# Patient Record
Sex: Female | Born: 1950 | Race: White | Hispanic: No | Smoking: Current every day smoker
Health system: Southern US, Community
[De-identification: ages and names within clinical notes are randomized; demographics above are authoritative.]

## PROBLEM LIST (undated history)

## (undated) DIAGNOSIS — K219 Gastro-esophageal reflux disease without esophagitis: Secondary | ICD-10-CM

## (undated) DIAGNOSIS — J449 Chronic obstructive pulmonary disease, unspecified: Secondary | ICD-10-CM

## (undated) DIAGNOSIS — E119 Type 2 diabetes mellitus without complications: Secondary | ICD-10-CM

## (undated) DIAGNOSIS — N289 Disorder of kidney and ureter, unspecified: Secondary | ICD-10-CM

---

## 2013-08-30 ENCOUNTER — Ambulatory Visit: Payer: Self-pay | Admitting: Family Medicine

## 2013-10-10 ENCOUNTER — Emergency Department: Payer: Self-pay | Admitting: Emergency Medicine

## 2013-10-10 LAB — BASIC METABOLIC PANEL
Anion Gap: 6 — ABNORMAL LOW (ref 7–16)
BUN: 16 mg/dL (ref 7–18)
Calcium, Total: 9.8 mg/dL (ref 8.5–10.1)
Chloride: 101 mmol/L (ref 98–107)
Co2: 26 mmol/L (ref 21–32)
Creatinine: 1.77 mg/dL — ABNORMAL HIGH (ref 0.60–1.30)
EGFR (African American): 36 — ABNORMAL LOW
EGFR (Non-African Amer.): 31 — ABNORMAL LOW
Glucose: 118 mg/dL — ABNORMAL HIGH (ref 65–99)
Osmolality: 269 (ref 275–301)
Potassium: 3.9 mmol/L (ref 3.5–5.1)
Sodium: 133 mmol/L — ABNORMAL LOW (ref 136–145)

## 2013-10-10 LAB — CBC
HCT: 43.2 % (ref 35.0–47.0)
HGB: 14.8 g/dL (ref 12.0–16.0)
MCH: 32.3 pg (ref 26.0–34.0)
MCHC: 34.2 g/dL (ref 32.0–36.0)
MCV: 94 fL (ref 80–100)
Platelet: 295 10*3/uL (ref 150–440)
RBC: 4.58 10*6/uL (ref 3.80–5.20)
RDW: 13.5 % (ref 11.5–14.5)
WBC: 10.1 10*3/uL (ref 3.6–11.0)

## 2013-10-13 ENCOUNTER — Ambulatory Visit: Payer: Self-pay | Admitting: Family Medicine

## 2013-12-06 ENCOUNTER — Ambulatory Visit: Payer: Self-pay | Admitting: Gastroenterology

## 2013-12-07 LAB — PATHOLOGY REPORT

## 2014-03-28 ENCOUNTER — Ambulatory Visit: Payer: Self-pay | Admitting: Physician Assistant

## 2014-04-24 ENCOUNTER — Ambulatory Visit: Payer: Self-pay | Admitting: Vascular Surgery

## 2014-04-24 LAB — BASIC METABOLIC PANEL
Anion Gap: 8 (ref 7–16)
BUN: 17 mg/dL (ref 7–18)
Calcium, Total: 9.6 mg/dL (ref 8.5–10.1)
Chloride: 105 mmol/L (ref 98–107)
Co2: 26 mmol/L (ref 21–32)
Creatinine: 1.89 mg/dL — ABNORMAL HIGH (ref 0.60–1.30)
EGFR (African American): 33 — ABNORMAL LOW
EGFR (Non-African Amer.): 28 — ABNORMAL LOW
Glucose: 162 mg/dL — ABNORMAL HIGH (ref 65–99)
Osmolality: 283 (ref 275–301)
Potassium: 4.5 mmol/L (ref 3.5–5.1)
Sodium: 139 mmol/L (ref 136–145)

## 2014-06-19 ENCOUNTER — Ambulatory Visit: Payer: Self-pay | Admitting: Vascular Surgery

## 2014-06-19 LAB — BASIC METABOLIC PANEL
Anion Gap: 9 (ref 7–16)
BUN: 19 mg/dL — ABNORMAL HIGH (ref 7–18)
Calcium, Total: 9.4 mg/dL (ref 8.5–10.1)
Chloride: 105 mmol/L (ref 98–107)
Co2: 26 mmol/L (ref 21–32)
Creatinine: 2.38 mg/dL — ABNORMAL HIGH (ref 0.60–1.30)
EGFR (African American): 25 — ABNORMAL LOW
EGFR (Non-African Amer.): 21 — ABNORMAL LOW
Glucose: 183 mg/dL — ABNORMAL HIGH (ref 65–99)
Osmolality: 286 (ref 275–301)
Potassium: 4.5 mmol/L (ref 3.5–5.1)
Sodium: 140 mmol/L (ref 136–145)

## 2014-06-20 ENCOUNTER — Ambulatory Visit: Payer: Self-pay | Admitting: Vascular Surgery

## 2014-06-20 LAB — CBC
HCT: 36.7 % (ref 35.0–47.0)
HGB: 12.2 g/dL (ref 12.0–16.0)
MCH: 31.6 pg (ref 26.0–34.0)
MCHC: 33.4 g/dL (ref 32.0–36.0)
MCV: 95 fL (ref 80–100)
Platelet: 266 10*3/uL (ref 150–440)
RBC: 3.88 10*6/uL (ref 3.80–5.20)
RDW: 13.3 % (ref 11.5–14.5)
WBC: 7.9 10*3/uL (ref 3.6–11.0)

## 2014-06-20 LAB — URINALYSIS, COMPLETE
Bacteria: NONE SEEN
Bilirubin,UR: NEGATIVE
Blood: NEGATIVE
Glucose,UR: NEGATIVE mg/dL (ref 0–75)
Ketone: NEGATIVE
Leukocyte Esterase: NEGATIVE
Nitrite: NEGATIVE
Ph: 7 (ref 4.5–8.0)
Protein: NEGATIVE
RBC,UR: NONE SEEN /HPF (ref 0–5)
Specific Gravity: 1.002 (ref 1.003–1.030)
Squamous Epithelial: 1
WBC UR: 1 /HPF (ref 0–5)

## 2014-06-20 LAB — BASIC METABOLIC PANEL
Anion Gap: 7 (ref 7–16)
BUN: 13 mg/dL (ref 7–18)
Calcium, Total: 9.2 mg/dL (ref 8.5–10.1)
Chloride: 99 mmol/L (ref 98–107)
Co2: 26 mmol/L (ref 21–32)
Creatinine: 1.95 mg/dL — ABNORMAL HIGH (ref 0.60–1.30)
EGFR (African American): 32 — ABNORMAL LOW
EGFR (Non-African Amer.): 27 — ABNORMAL LOW
Glucose: 88 mg/dL (ref 65–99)
Osmolality: 264 (ref 275–301)
Potassium: 4.2 mmol/L (ref 3.5–5.1)
Sodium: 132 mmol/L — ABNORMAL LOW (ref 136–145)

## 2014-06-28 ENCOUNTER — Inpatient Hospital Stay: Payer: Self-pay | Admitting: Vascular Surgery

## 2014-06-29 LAB — BASIC METABOLIC PANEL
Anion Gap: 10 (ref 7–16)
BUN: 21 mg/dL — ABNORMAL HIGH (ref 7–18)
Calcium, Total: 8.8 mg/dL (ref 8.5–10.1)
Chloride: 109 mmol/L — ABNORMAL HIGH (ref 98–107)
Co2: 24 mmol/L (ref 21–32)
Creatinine: 2 mg/dL — ABNORMAL HIGH (ref 0.60–1.30)
EGFR (African American): 31 — ABNORMAL LOW
EGFR (Non-African Amer.): 26 — ABNORMAL LOW
Glucose: 104 mg/dL — ABNORMAL HIGH (ref 65–99)
Osmolality: 288 (ref 275–301)
Potassium: 4.6 mmol/L (ref 3.5–5.1)
Sodium: 143 mmol/L (ref 136–145)

## 2014-06-29 LAB — CBC WITH DIFFERENTIAL/PLATELET
Basophil #: 0 10*3/uL (ref 0.0–0.1)
Basophil %: 0.2 %
Eosinophil #: 0 10*3/uL (ref 0.0–0.7)
Eosinophil %: 0.1 %
HCT: 32.3 % — ABNORMAL LOW (ref 35.0–47.0)
HGB: 10.9 g/dL — ABNORMAL LOW (ref 12.0–16.0)
Lymphocyte #: 0.8 10*3/uL — ABNORMAL LOW (ref 1.0–3.6)
Lymphocyte %: 10.1 %
MCH: 33.1 pg (ref 26.0–34.0)
MCHC: 33.6 g/dL (ref 32.0–36.0)
MCV: 98 fL (ref 80–100)
Monocyte #: 0.4 x10 3/mm (ref 0.2–0.9)
Monocyte %: 5.6 %
Neutrophil #: 6.8 10*3/uL — ABNORMAL HIGH (ref 1.4–6.5)
Neutrophil %: 84 %
Platelet: 240 10*3/uL (ref 150–440)
RBC: 3.28 10*6/uL — ABNORMAL LOW (ref 3.80–5.20)
RDW: 14 % (ref 11.5–14.5)
WBC: 8 10*3/uL (ref 3.6–11.0)

## 2014-06-29 LAB — PATHOLOGY REPORT

## 2014-06-29 LAB — PROTIME-INR
INR: 1.1
Prothrombin Time: 13.6 secs (ref 11.5–14.7)

## 2014-06-29 LAB — APTT: Activated PTT: 25.7 secs (ref 23.6–35.9)

## 2014-08-16 ENCOUNTER — Ambulatory Visit: Payer: Self-pay | Admitting: Internal Medicine

## 2014-08-22 ENCOUNTER — Ambulatory Visit: Payer: Self-pay | Admitting: Internal Medicine

## 2014-09-21 ENCOUNTER — Ambulatory Visit: Payer: Self-pay | Admitting: Internal Medicine

## 2014-11-06 ENCOUNTER — Ambulatory Visit: Payer: Self-pay | Admitting: Internal Medicine

## 2014-11-13 ENCOUNTER — Ambulatory Visit: Payer: Self-pay | Admitting: Internal Medicine

## 2014-11-14 ENCOUNTER — Ambulatory Visit: Payer: Self-pay | Admitting: Internal Medicine

## 2014-12-26 DIAGNOSIS — E1122 Type 2 diabetes mellitus with diabetic chronic kidney disease: Secondary | ICD-10-CM | POA: Diagnosis not present

## 2014-12-26 DIAGNOSIS — F17210 Nicotine dependence, cigarettes, uncomplicated: Secondary | ICD-10-CM | POA: Diagnosis not present

## 2014-12-26 DIAGNOSIS — J449 Chronic obstructive pulmonary disease, unspecified: Secondary | ICD-10-CM | POA: Diagnosis not present

## 2014-12-26 DIAGNOSIS — N183 Chronic kidney disease, stage 3 (moderate): Secondary | ICD-10-CM | POA: Diagnosis not present

## 2014-12-26 DIAGNOSIS — I10 Essential (primary) hypertension: Secondary | ICD-10-CM | POA: Diagnosis not present

## 2015-01-23 DIAGNOSIS — R809 Proteinuria, unspecified: Secondary | ICD-10-CM | POA: Diagnosis not present

## 2015-01-23 DIAGNOSIS — N184 Chronic kidney disease, stage 4 (severe): Secondary | ICD-10-CM | POA: Diagnosis not present

## 2015-01-23 DIAGNOSIS — N2581 Secondary hyperparathyroidism of renal origin: Secondary | ICD-10-CM | POA: Diagnosis not present

## 2015-01-25 DIAGNOSIS — E1122 Type 2 diabetes mellitus with diabetic chronic kidney disease: Secondary | ICD-10-CM | POA: Diagnosis not present

## 2015-01-25 DIAGNOSIS — F17210 Nicotine dependence, cigarettes, uncomplicated: Secondary | ICD-10-CM | POA: Diagnosis not present

## 2015-01-25 DIAGNOSIS — I10 Essential (primary) hypertension: Secondary | ICD-10-CM | POA: Diagnosis not present

## 2015-01-25 DIAGNOSIS — N183 Chronic kidney disease, stage 3 (moderate): Secondary | ICD-10-CM | POA: Diagnosis not present

## 2015-01-30 DIAGNOSIS — F17211 Nicotine dependence, cigarettes, in remission: Secondary | ICD-10-CM | POA: Diagnosis not present

## 2015-01-30 DIAGNOSIS — J449 Chronic obstructive pulmonary disease, unspecified: Secondary | ICD-10-CM | POA: Diagnosis not present

## 2015-02-15 ENCOUNTER — Emergency Department: Payer: Self-pay | Admitting: Emergency Medicine

## 2015-02-15 DIAGNOSIS — R112 Nausea with vomiting, unspecified: Secondary | ICD-10-CM | POA: Diagnosis not present

## 2015-02-15 DIAGNOSIS — Z7901 Long term (current) use of anticoagulants: Secondary | ICD-10-CM | POA: Diagnosis not present

## 2015-02-15 DIAGNOSIS — R42 Dizziness and giddiness: Secondary | ICD-10-CM | POA: Diagnosis not present

## 2015-02-15 DIAGNOSIS — R110 Nausea: Secondary | ICD-10-CM | POA: Diagnosis not present

## 2015-02-15 DIAGNOSIS — Z79899 Other long term (current) drug therapy: Secondary | ICD-10-CM | POA: Diagnosis not present

## 2015-02-15 DIAGNOSIS — I10 Essential (primary) hypertension: Secondary | ICD-10-CM | POA: Diagnosis not present

## 2015-02-15 DIAGNOSIS — Z7982 Long term (current) use of aspirin: Secondary | ICD-10-CM | POA: Diagnosis not present

## 2015-02-15 DIAGNOSIS — R531 Weakness: Secondary | ICD-10-CM | POA: Diagnosis not present

## 2015-02-15 DIAGNOSIS — E119 Type 2 diabetes mellitus without complications: Secondary | ICD-10-CM | POA: Diagnosis not present

## 2015-02-15 DIAGNOSIS — Z720 Tobacco use: Secondary | ICD-10-CM | POA: Diagnosis not present

## 2015-02-20 DIAGNOSIS — N183 Chronic kidney disease, stage 3 (moderate): Secondary | ICD-10-CM | POA: Diagnosis not present

## 2015-03-28 DIAGNOSIS — I10 Essential (primary) hypertension: Secondary | ICD-10-CM | POA: Diagnosis not present

## 2015-03-28 DIAGNOSIS — F17210 Nicotine dependence, cigarettes, uncomplicated: Secondary | ICD-10-CM | POA: Diagnosis not present

## 2015-03-28 DIAGNOSIS — E1122 Type 2 diabetes mellitus with diabetic chronic kidney disease: Secondary | ICD-10-CM | POA: Diagnosis not present

## 2015-03-28 DIAGNOSIS — I6523 Occlusion and stenosis of bilateral carotid arteries: Secondary | ICD-10-CM | POA: Diagnosis not present

## 2015-04-05 DIAGNOSIS — I6523 Occlusion and stenosis of bilateral carotid arteries: Secondary | ICD-10-CM | POA: Diagnosis not present

## 2015-04-14 NOTE — Op Note (Signed)
PATIENT NAME:  Deanna [name], WAHLER MR#:  [id number] DATE OF BIRTH:  [birth year]  DATE OF PROCEDURE:  04/24/2014  PREOPERATIVE DIAGNOSES: 1. Peripheral arterial disease with claudication, bilateral lower extremities, and gangrenous changes to right 5th toe.  2.  Hypertension.  3.  Chronic kidney disease, stage III.  POSTOPERATIVE DIAGNOSES: 1. Peripheral arterial disease with claudication, bilateral lower extremities, and gangrenous changes to right 5th toe.  2.  Hypertension.  3.  Chronic kidney disease, stage III.  PROCEDURES PERFORMED: 1.  Ultrasound guidance for vascular access to bilateral femoral arteries.  2.  Catheter placement to aorta from bilateral femoral approaches.  3.  Aortogram and selective right lower extremity angiogram.  4.  Percutaneous transluminal angioplasty with drug-coated angioplasty balloon on the right and conventional angioplasty balloon on the left.  5.  Kissing balloon stents to bilateral common iliac arteries and distal aorta for greater than 50% residual stenosis after angioplasty.  6.  StarClose closure device to bilateral femoral arteries.   SURGEON: Annice Needy, M.D.   ANESTHESIA: Local with moderate conscious sedation.   ESTIMATED BLOOD LOSS: 25 mL.   CONTRAST USED: 80 mL Visipaque.   INDICATION FOR PROCEDURE: A 64 year old white female presented to the office with gangrenous changes to the tip of the right 5th toe, as well as bilateral lower extremity claudication. Her ABIs were markedly reduced on the right and mildly reduced on the left. She is brought in for angiography for further evaluation and potential treatment. Risks and benefits were discussed. Informed consent was obtained.   DESCRIPTION OF PROCEDURE: The patient was brought to the vascular suite. Groins were shaved and prepped and a sterile surgical field was created. Ultrasound was used to visualize patent femoral arteries bilaterally, initially on the left, and the arteries were  accessed under direct ultrasound guidance. 6-French sheaths were placed bilaterally.  Imaging performed through the pigtail catheter placed up the left, and showed a 95% stenosis in the proximal right common iliac artery at its origin. The distal aorta was diseased. The left common iliac artery had also had some degree of disease, although it was not as tight. The patient was systemically heparinized with 4000 units of intravenous heparin for systemic anticoagulation. I then performed kissing balloon angioplasty in the proximal common iliac arteries going up into the distal aorta; a 6 mm diameter Lutonix balloon on the right and a 6 mm diameter conventional balloon on the left was used. Following this, there was still residual disease in the distal aorta and the proximal common iliac artery, worse on the right with a dissection. I then elected to place balloon expandable stents; 7 mm diameter x 59 mm length balloon expandable stents were placed in the common iliac arteries bilaterally, and going up into the distal aorta for about a centimeter, and these were deployed encompassing the lesions. Waists were taken, which resolved with angioplasty. Completion angiogram following this showed markedly improved flow with stents being widely patent. Through the right femoral sheath, I performed right lower extremity angiogram, which showed no flow-limiting stenosis in the common femoral artery, superficial femoral or popliteal artery, with the best runoff being through the posterior tibial artery into the foot. The sheath was removed. StarClose closure device was deployed in the usual fashion with excellent hemostatic result. The patient tolerated the procedure well and was taken to the recovery room in stable condition.    ____________________________ Annice Needy, MD jsd:dmm D: 04/24/2014 10:42:28 ET T: 04/24/2014 10:57:35 ET JOB#: [id number]  cc:  Annice NeedyJason S. Dew, MD, <Dictator> Annice NeedyJASON S DEW MD ELECTRONICALLY SIGNED  04/27/2014 14:25

## 2015-04-14 NOTE — Op Note (Signed)
PATIENT NAME:  Deanna [name], Deanna [name] MR#:  [id number] DATE OF BIRTH:  September 02, 1951  DATE OF PROCEDURE:  06/19/2014  PREOPERATIVE DIAGNOSES: 1.  Left carotid artery stenosis on duplex.  2.  Chronic kidney disease precluding CT angiogram.  3.  Peripheral arterial disease status post previous intervention.  4.  Hypertension.   POSTOPERATIVE DIAGNOSES: 1.  Left carotid artery stenosis on duplex.  2.  Chronic kidney disease precluding CT angiogram.  3.  Peripheral arterial disease status post previous intervention.  4.  Hypertension.   PROCEDURES: 1.  Ultrasound guidance for vascular access, right femoral artery.  2.  Catheter placement into left common carotid artery from right femoral approach.  3.  Thoracic aortogram and cervical and cerebral left carotid angiogram.  4.  StarClose closure device, right femoral artery.   SURGEON: Annice NeedyJason S. Dew, M.D.   ANESTHESIA: Local with moderate conscious sedation.   ESTIMATED BLOOD LOSS: 25 mL.  FLUOROSCOPY TIME: Approximately 3 minutes.  CONTRAST USED: Approximately 32 mL.  INDICATION FOR PROCEDURE: This is a 64 year old female with multiple medical issues. I treated her for peripheral arterial disease several weeks ago. She has significant chronic kidney disease. A duplex has shown a high-grade left carotid artery stenosis. She cannot get a CT angiogram due to her renal insufficiency. We are performing a catheter-based angiogram with a limited amount of contrast to further evaluate this lesion. Risks and benefits were discussed. Informed consent was obtained.   DESCRIPTION OF PROCEDURE: The patient was brought to the vascular suite. Groins were sterilely prepped and draped and a sterile surgical field was created. The right femoral artery was accessed under direct ultrasound guidance without difficulty with a Seldinger needle. A J-wire and 5-French sheath were then placed. A pigtail catheter was placed into the ascending aorta and a LAO projection  thoracic aortogram was performed. This demonstrated a bovine configuration of the arch without flow-limiting stenosis in the proximal portions of the great vessel. The left common carotid artery did fill a little bit slower indicating more distal disease. I then gave the patient 3000 units of intravenous heparin. A JB-2 catheter was used to selectively cannulate the left common carotid artery, which was done without difficulty. It was advanced to the mid left common carotid artery and performed cervical and cerebral left carotid angiography. This demonstrated a greater than 95% high-grade stenosis of the left internal carotid artery at its origin. The external carotid artery also had disease. There appeared to be a reasonably low carotid bifurcation, in the C4 range. LAO lateral and AP projections were performed to confirm the stenosis. Intracerebral imaging was performed, and she was found to have quite pruned circulation in the intracerebral portion of the internal carotid artery due to the stenosis below. However, there did not appear to be any focal deficits present. At point I elevated to terminate the procedure. The sheath was removed. A StarClose closure device was deployed in the usual fashion with excellent hemostatic result. The patient tolerated the procedure well and was taken to the recovery room in stable condition.   ____________________________ Annice NeedyJason S. Dew, MD jsd:sb D: 06/19/2014 08:53:54 ET T: 06/19/2014 09:33:06 ET JOB#: [id number]  cc: Annice NeedyJason S. Dew, MD, <Dictator> Lyndon CodeFozia M. Khan, MD Annice NeedyJASON S DEW MD ELECTRONICALLY SIGNED 06/26/2014 15:10

## 2015-04-14 NOTE — Op Note (Signed)
PATIENT NAME:  Deanna [name], Deanna [name] MR#:  [id number] DATE OF BIRTH:  07/23/1951  DATE OF PROCEDURE:  06/28/2014  PREOPERATIVE DIAGNOSES:  1. High-grade left carotid artery stenosis.  2. Peripheral arterial disease, status post previous intervention.  3. Chronic kidney disease.  4. Chronic obstructive pulmonary disease.  5. Diabetes.   POSTOPERATIVE DIAGNOSES: 1. High-grade left carotid artery stenosis.  2. Peripheral arterial disease, status post previous intervention.  3. Chronic kidney disease.  4. Chronic obstructive pulmonary disease.  5. Diabetes.   PROCEDURE:  Left carotid endarterectomy with CorMatrix arterial patch reconstruction.   SURGEON: Annice Needy, MD.  ANESTHESIA: General.   ESTIMATED BLOOD LOSS: 75 mL.   INDICATION FOR PROCEDURE: A 64 year old white female with recent finding of high-grade left carotid artery stenosis.  Given her reasonably young age and greater than 90% stenosis on angiography, she was recommended to have intervention for her carotid disease.  Carotid endarterectomy was recommended. Risks and benefits were discussed. Informed consent was obtained.   DESCRIPTION OF PROCEDURE: The patient is brought to the operative suite, and after an adequate level of general anesthesia was obtained, she was placed in modified beach chair position.  Her neck was sterilely prepped and draped and a sterile surgical field was created. An incision was created along the anterior border of the sternocleidomastoid. I dissected down through the platysma with electrocautery. Two Weitlaner retractors were used to help facilitate our exposure. The facial vein was ligated and divided with silk ties as were a couple small venous branches. This exposed the carotid artery and the carotid bifurcation which was reasonably low lying, but the vessel was small.  I encircled the common carotid artery, external carotid artery and internal carotid artery distal lesion with vessel loops.  The  superior thyroid artery was small and was ligated and divided between silk ties. I then heparinized the patient with 6000 units of intravenous heparin. After this had circulated for 4 to 5 minutes, I pulled up control on the vessel loops. An anterior wall arteriotomy was created with an 11 blade and extended with Potts scissors. The Pruitt-[place]ara shunt was placed first in the internal carotid artery, flushed and de-aired, and then in the common carotid artery and flow was restored. Approximate time from clamping to flow restoration was 2 minutes.  I then performed an endarterectomy in the usual fashion. The [facility] was used to create a nice endarterectomy plane. The proximal endpoint was cut flush with tenotomy scissors. An eversion endarterectomy was performed on the external carotid artery and a nice feathered distal endpoint was created with gentle traction on the internal carotid artery.  The distal endpoint was tacked down with three 7-0 Prolene sutures. All loose flecks were removed.   Due to the small size of the vessel, the CorMatrix extracellular patch was brought onto the field.  It was cut and beveled, started distally with a 6-0 Prolene and run one half of the length of the arteriotomy. A second 6-0 Prolene was started at the proximal endpoint after the patch was cut and beveled to an appropriate length and we ran the medial suture line first, tying these 2 suture lines together. The lateral suture line was then run approximately one quarter of the length of the arteriotomy.  The shunt was then removed and the vessel was flushed in the internal, external, and common carotid arteries and locally heparinized.  I then completed the suture line flushing through the external carotid artery several cardiac cycles prior to release,  control distally.  Time from shunt removal to restoration of flow was approximately 2 minutes. A single 6-0 Prolene patch suture was used for hemostasis. The wound was  irrigated. Surgicel and Evicel topical hemostatic agents were placed and hemostasis was complete.    The wound was then closed with 4 interrupted 3-0 Vicryl sutures in the sternocleidomastoid space. The platysma was closed with a running 3-0 Vicryl and the skin was closed with 4-0 Monocryl. Dermabond was placed as a dressing.   The patient was awakened from anesthesia, following commands, and taken to the recovery room in stable condition, appearing to tolerate the procedure well.     ____________________________ Annice NeedyJason S. Lonnel Gjerde, MD jsd:by D: 06/28/2014 12:25:30 ET T: 06/28/2014 22:09:03 ET JOB#: [id number]  cc: Annice NeedyJason S. Shaneil Yazdi, MD, <Dictator> Lyndon CodeFozia M. Khan, MD Annice NeedyJASON S Veron Senner MD ELECTRONICALLY SIGNED 07/27/2014 12:39

## 2015-04-24 DIAGNOSIS — N184 Chronic kidney disease, stage 4 (severe): Secondary | ICD-10-CM | POA: Diagnosis not present

## 2015-04-24 DIAGNOSIS — R809 Proteinuria, unspecified: Secondary | ICD-10-CM | POA: Diagnosis not present

## 2015-04-24 DIAGNOSIS — N2581 Secondary hyperparathyroidism of renal origin: Secondary | ICD-10-CM | POA: Diagnosis not present

## 2015-05-17 ENCOUNTER — Encounter: Payer: Self-pay | Admitting: *Deleted

## 2015-05-28 DIAGNOSIS — I739 Peripheral vascular disease, unspecified: Secondary | ICD-10-CM | POA: Diagnosis not present

## 2015-05-28 DIAGNOSIS — R0602 Shortness of breath: Secondary | ICD-10-CM | POA: Diagnosis not present

## 2015-05-28 DIAGNOSIS — I10 Essential (primary) hypertension: Secondary | ICD-10-CM | POA: Diagnosis not present

## 2015-05-28 DIAGNOSIS — I6523 Occlusion and stenosis of bilateral carotid arteries: Secondary | ICD-10-CM | POA: Diagnosis not present

## 2015-05-28 DIAGNOSIS — E1122 Type 2 diabetes mellitus with diabetic chronic kidney disease: Secondary | ICD-10-CM | POA: Diagnosis not present

## 2015-05-28 DIAGNOSIS — F17210 Nicotine dependence, cigarettes, uncomplicated: Secondary | ICD-10-CM | POA: Diagnosis not present

## 2015-06-05 ENCOUNTER — Encounter: Payer: Self-pay | Admitting: *Deleted

## 2015-06-29 DIAGNOSIS — I700 Atherosclerosis of aorta: Secondary | ICD-10-CM | POA: Diagnosis not present

## 2015-06-29 DIAGNOSIS — E785 Hyperlipidemia, unspecified: Secondary | ICD-10-CM | POA: Diagnosis not present

## 2015-06-29 DIAGNOSIS — I10 Essential (primary) hypertension: Secondary | ICD-10-CM | POA: Diagnosis not present

## 2015-06-29 DIAGNOSIS — I739 Peripheral vascular disease, unspecified: Secondary | ICD-10-CM | POA: Diagnosis not present

## 2015-06-29 DIAGNOSIS — I6529 Occlusion and stenosis of unspecified carotid artery: Secondary | ICD-10-CM | POA: Diagnosis not present

## 2015-08-07 DIAGNOSIS — R05 Cough: Secondary | ICD-10-CM | POA: Diagnosis not present

## 2015-08-07 DIAGNOSIS — R0602 Shortness of breath: Secondary | ICD-10-CM | POA: Diagnosis not present

## 2015-09-06 DIAGNOSIS — I10 Essential (primary) hypertension: Secondary | ICD-10-CM | POA: Diagnosis not present

## 2015-09-06 DIAGNOSIS — R809 Proteinuria, unspecified: Secondary | ICD-10-CM | POA: Diagnosis not present

## 2015-09-06 DIAGNOSIS — N184 Chronic kidney disease, stage 4 (severe): Secondary | ICD-10-CM | POA: Diagnosis not present

## 2015-09-06 DIAGNOSIS — N2581 Secondary hyperparathyroidism of renal origin: Secondary | ICD-10-CM | POA: Diagnosis not present

## 2015-10-05 IMAGING — CR DG CHEST 2V
1 series · 2 of 2 positions shown · non-contrast
Comparison: None.

CLINICAL DATA: Preadmit, high blood pressure

EXAM:
CHEST  2 VIEW

[Series 1: pa · 0.17mm/px · 2 of 2 slices shown]
[im 1/2]
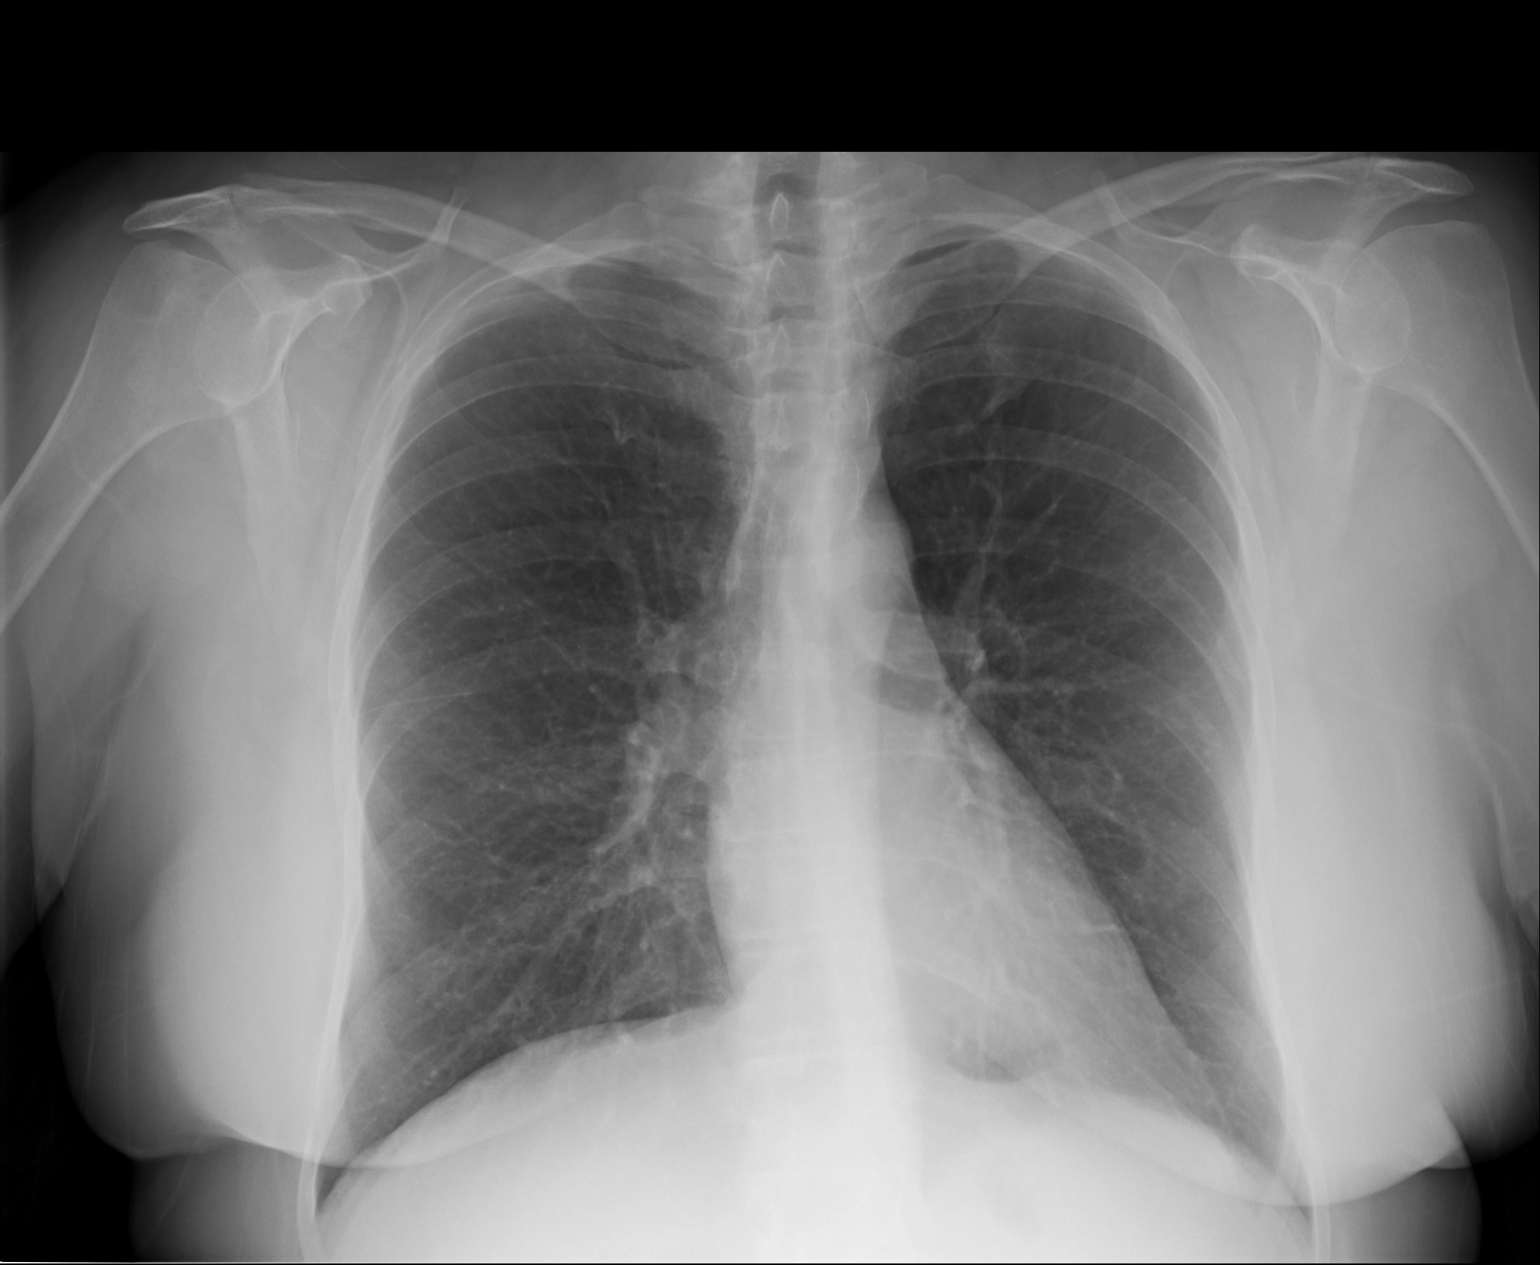
[im 2/2]
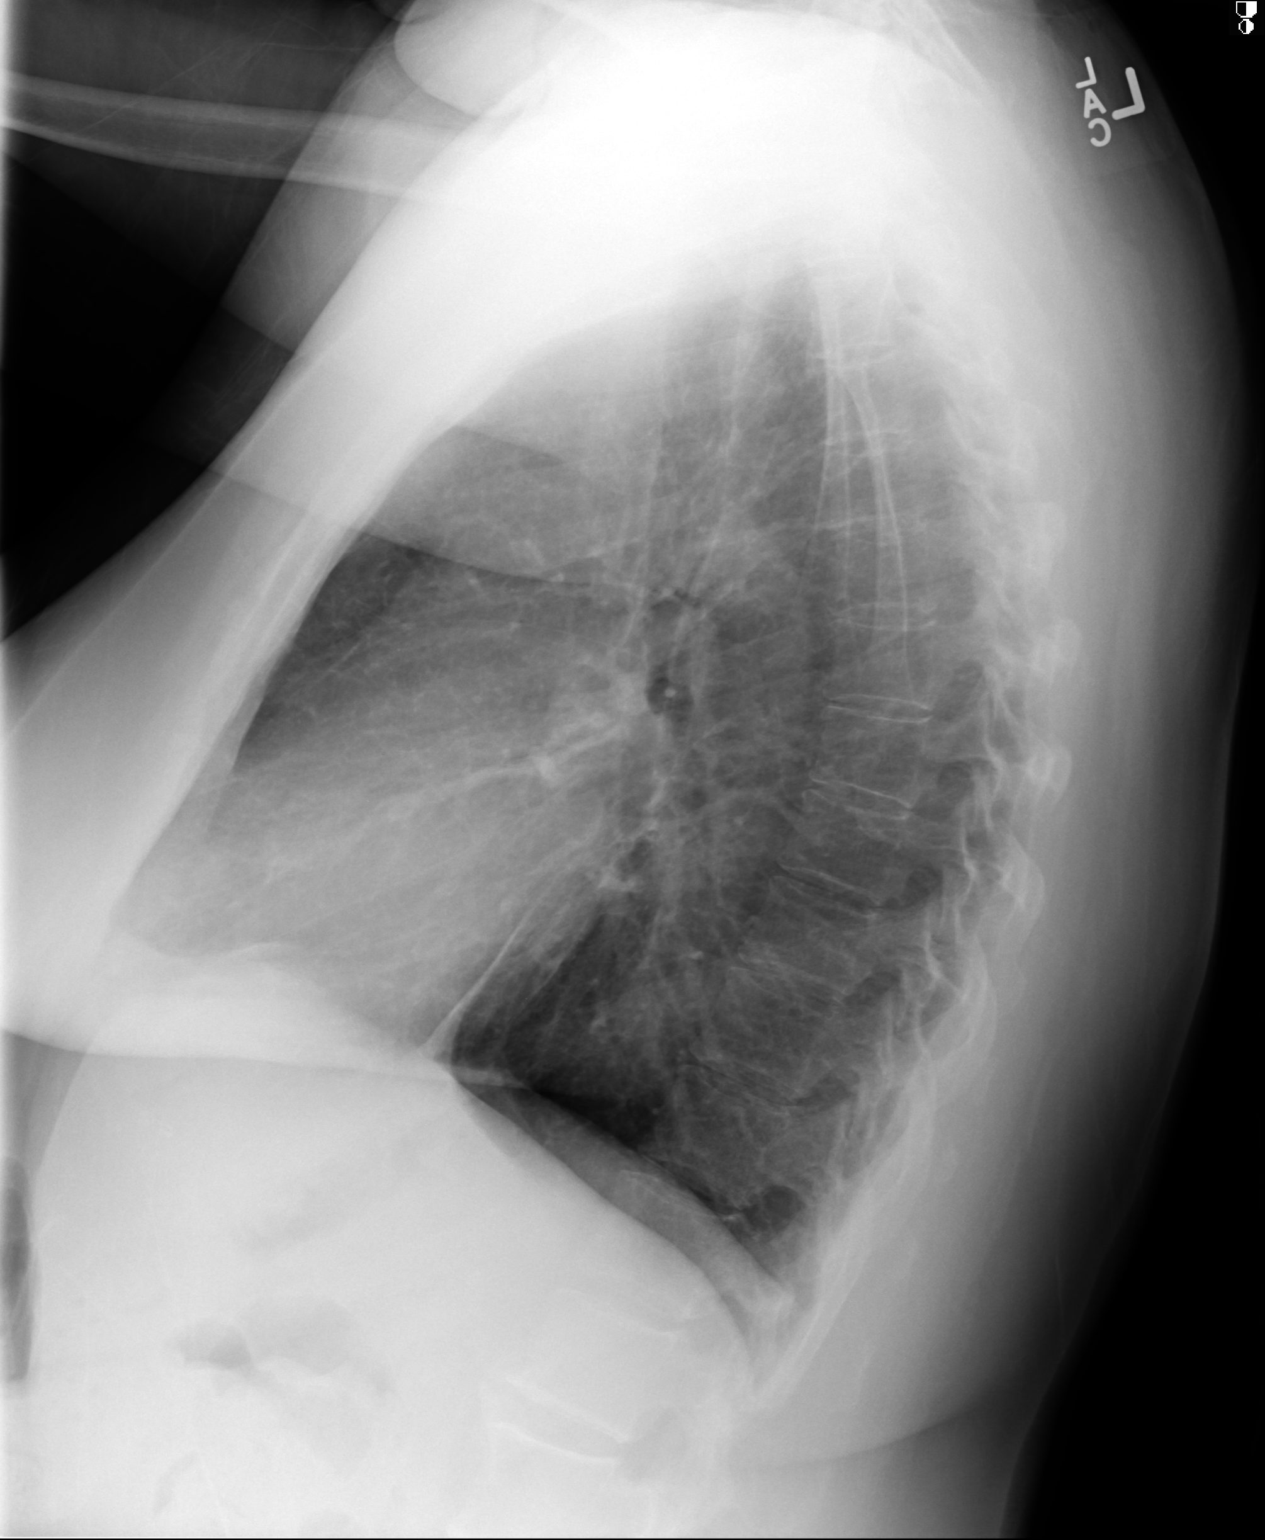

[2 of 2 positions shown; findings below may reference images not displayed]

FINDINGS: The heart size and mediastinal contours are within normal limits.
Both lungs are clear. The visualized skeletal structures are
unremarkable.
IMPRESSION: No active cardiopulmonary disease.

## 2015-10-16 DIAGNOSIS — Z23 Encounter for immunization: Secondary | ICD-10-CM | POA: Diagnosis not present

## 2015-10-16 DIAGNOSIS — I739 Peripheral vascular disease, unspecified: Secondary | ICD-10-CM | POA: Diagnosis not present

## 2015-10-16 DIAGNOSIS — R87810 Cervical high risk human papillomavirus (HPV) DNA test positive: Secondary | ICD-10-CM | POA: Diagnosis not present

## 2015-10-16 DIAGNOSIS — I10 Essential (primary) hypertension: Secondary | ICD-10-CM | POA: Diagnosis not present

## 2015-10-16 DIAGNOSIS — Z0001 Encounter for general adult medical examination with abnormal findings: Secondary | ICD-10-CM | POA: Diagnosis not present

## 2015-10-16 DIAGNOSIS — Z124 Encounter for screening for malignant neoplasm of cervix: Secondary | ICD-10-CM | POA: Diagnosis not present

## 2015-10-16 DIAGNOSIS — E782 Mixed hyperlipidemia: Secondary | ICD-10-CM | POA: Diagnosis not present

## 2015-10-16 DIAGNOSIS — E1122 Type 2 diabetes mellitus with diabetic chronic kidney disease: Secondary | ICD-10-CM | POA: Diagnosis not present

## 2015-10-16 DIAGNOSIS — B372 Candidiasis of skin and nail: Secondary | ICD-10-CM | POA: Diagnosis not present

## 2015-10-16 DIAGNOSIS — F17210 Nicotine dependence, cigarettes, uncomplicated: Secondary | ICD-10-CM | POA: Diagnosis not present

## 2015-11-19 DIAGNOSIS — E2839 Other primary ovarian failure: Secondary | ICD-10-CM | POA: Diagnosis not present

## 2015-11-19 DIAGNOSIS — M8588 Other specified disorders of bone density and structure, other site: Secondary | ICD-10-CM | POA: Diagnosis not present

## 2015-11-19 DIAGNOSIS — Z1231 Encounter for screening mammogram for malignant neoplasm of breast: Secondary | ICD-10-CM | POA: Diagnosis not present

## 2015-11-19 DIAGNOSIS — M85862 Other specified disorders of bone density and structure, left lower leg: Secondary | ICD-10-CM | POA: Diagnosis not present

## 2015-11-19 DIAGNOSIS — M85852 Other specified disorders of bone density and structure, left thigh: Secondary | ICD-10-CM | POA: Diagnosis not present

## 2015-11-29 DIAGNOSIS — R0602 Shortness of breath: Secondary | ICD-10-CM | POA: Diagnosis not present

## 2015-12-14 DIAGNOSIS — H2513 Age-related nuclear cataract, bilateral: Secondary | ICD-10-CM | POA: Diagnosis not present

## 2015-12-25 DIAGNOSIS — E1122 Type 2 diabetes mellitus with diabetic chronic kidney disease: Secondary | ICD-10-CM | POA: Diagnosis not present

## 2015-12-25 DIAGNOSIS — I10 Essential (primary) hypertension: Secondary | ICD-10-CM | POA: Diagnosis not present

## 2015-12-25 DIAGNOSIS — E782 Mixed hyperlipidemia: Secondary | ICD-10-CM | POA: Diagnosis not present

## 2015-12-25 DIAGNOSIS — N183 Chronic kidney disease, stage 3 (moderate): Secondary | ICD-10-CM | POA: Diagnosis not present

## 2015-12-25 DIAGNOSIS — N2581 Secondary hyperparathyroidism of renal origin: Secondary | ICD-10-CM | POA: Diagnosis not present

## 2015-12-25 DIAGNOSIS — Z0001 Encounter for general adult medical examination with abnormal findings: Secondary | ICD-10-CM | POA: Diagnosis not present

## 2016-01-02 DIAGNOSIS — J439 Emphysema, unspecified: Secondary | ICD-10-CM | POA: Diagnosis not present

## 2016-04-15 ENCOUNTER — Inpatient Hospital Stay
Admission: EM | Admit: 2016-04-15 | Discharge: 2016-04-25 | DRG: 885 | Disposition: A | Discharge status: Home / Self Care | Payer: 59 | Source: Intra-hospital | Attending: Psychiatry | Admitting: Psychiatry

## 2016-04-15 ENCOUNTER — Encounter: Payer: Self-pay | Admitting: Emergency Medicine

## 2016-04-15 ENCOUNTER — Emergency Department
Admission: EM | Admit: 2016-04-15 | Discharge: 2016-04-15 | Disposition: A | Discharge status: Other Acute Inpatient Hospital | Payer: Medicare Other | Attending: Emergency Medicine | Admitting: Emergency Medicine

## 2016-04-15 DIAGNOSIS — F312 Bipolar disorder, current episode manic severe with psychotic features: Secondary | ICD-10-CM | POA: Diagnosis present

## 2016-04-15 DIAGNOSIS — F17210 Nicotine dependence, cigarettes, uncomplicated: Secondary | ICD-10-CM | POA: Insufficient documentation

## 2016-04-15 DIAGNOSIS — K219 Gastro-esophageal reflux disease without esophagitis: Secondary | ICD-10-CM | POA: Diagnosis present

## 2016-04-15 DIAGNOSIS — J449 Chronic obstructive pulmonary disease, unspecified: Secondary | ICD-10-CM | POA: Diagnosis present

## 2016-04-15 DIAGNOSIS — F22 Delusional disorders: Secondary | ICD-10-CM | POA: Insufficient documentation

## 2016-04-15 DIAGNOSIS — Z888 Allergy status to other drugs, medicaments and biological substances status: Secondary | ICD-10-CM

## 2016-04-15 DIAGNOSIS — I10 Essential (primary) hypertension: Secondary | ICD-10-CM | POA: Diagnosis present

## 2016-04-15 DIAGNOSIS — R634 Abnormal weight loss: Secondary | ICD-10-CM | POA: Diagnosis present

## 2016-04-15 DIAGNOSIS — Z882 Allergy status to sulfonamides status: Secondary | ICD-10-CM

## 2016-04-15 DIAGNOSIS — Z9119 Patient's noncompliance with other medical treatment and regimen: Secondary | ICD-10-CM | POA: Diagnosis not present

## 2016-04-15 DIAGNOSIS — I2510 Atherosclerotic heart disease of native coronary artery without angina pectoris: Secondary | ICD-10-CM

## 2016-04-15 DIAGNOSIS — F17200 Nicotine dependence, unspecified, uncomplicated: Secondary | ICD-10-CM | POA: Diagnosis present

## 2016-04-15 DIAGNOSIS — G4700 Insomnia, unspecified: Secondary | ICD-10-CM | POA: Diagnosis present

## 2016-04-15 DIAGNOSIS — E119 Type 2 diabetes mellitus without complications: Secondary | ICD-10-CM | POA: Diagnosis present

## 2016-04-15 DIAGNOSIS — N289 Disorder of kidney and ureter, unspecified: Secondary | ICD-10-CM

## 2016-04-15 DIAGNOSIS — Z9114 Patient's other noncompliance with medication regimen: Secondary | ICD-10-CM

## 2016-04-15 DIAGNOSIS — E1122 Type 2 diabetes mellitus with diabetic chronic kidney disease: Secondary | ICD-10-CM | POA: Diagnosis not present

## 2016-04-15 DIAGNOSIS — R4182 Altered mental status, unspecified: Secondary | ICD-10-CM | POA: Diagnosis not present

## 2016-04-15 DIAGNOSIS — N184 Chronic kidney disease, stage 4 (severe): Secondary | ICD-10-CM | POA: Diagnosis not present

## 2016-04-15 DIAGNOSIS — F3110 Bipolar disorder, current episode manic without psychotic features, unspecified: Secondary | ICD-10-CM | POA: Diagnosis present

## 2016-04-15 HISTORY — DX: Gastro-esophageal reflux disease without esophagitis: K21.9

## 2016-04-15 HISTORY — DX: Disorder of kidney and ureter, unspecified: N28.9

## 2016-04-15 HISTORY — DX: Chronic obstructive pulmonary disease, unspecified: J44.9

## 2016-04-15 HISTORY — DX: Type 2 diabetes mellitus without complications: E11.9

## 2016-04-15 LAB — COMPREHENSIVE METABOLIC PANEL
ALT: 35 U/L (ref 14–54)
AST: 48 U/L — ABNORMAL HIGH (ref 15–41)
Albumin: 4.1 g/dL (ref 3.5–5.0)
Alkaline Phosphatase: 70 U/L (ref 38–126)
Anion gap: 10 (ref 5–15)
BUN: 14 mg/dL (ref 6–20)
CO2: 25 mmol/L (ref 22–32)
Calcium: 9.3 mg/dL (ref 8.9–10.3)
Chloride: 101 mmol/L (ref 101–111)
Creatinine, Ser: 1.48 mg/dL — ABNORMAL HIGH (ref 0.44–1.00)
GFR calc Af Amer: 43 mL/min — ABNORMAL LOW (ref >60)
GFR calc non Af Amer: 37 mL/min — ABNORMAL LOW (ref >60)
Glucose, Bld: 121 mg/dL — ABNORMAL HIGH (ref 65–99)
Potassium: 3.2 mmol/L — ABNORMAL LOW (ref 3.5–5.1)
Sodium: 136 mmol/L (ref 135–145)
Total Bilirubin: 0.5 mg/dL (ref 0.3–1.2)
Total Protein: 7.4 g/dL (ref 6.5–8.1)

## 2016-04-15 LAB — CBC WITH DIFFERENTIAL/PLATELET
Basophils Absolute: 0.1 10*3/uL (ref 0–0.1)
Basophils Relative: 1 %
Eosinophils Absolute: 0.1 10*3/uL (ref 0–0.7)
Eosinophils Relative: 1 %
HCT: 38.5 % (ref 35.0–47.0)
Hemoglobin: 12.7 g/dL (ref 12.0–16.0)
Lymphocytes Relative: 18 %
Lymphs Abs: 1.9 10*3/uL (ref 1.0–3.6)
MCH: 32.5 pg (ref 26.0–34.0)
MCHC: 32.9 g/dL (ref 32.0–36.0)
MCV: 98.7 fL (ref 80.0–100.0)
Monocytes Absolute: 0.6 10*3/uL (ref 0.2–0.9)
Monocytes Relative: 6 %
Neutro Abs: 8.1 10*3/uL — ABNORMAL HIGH (ref 1.4–6.5)
Neutrophils Relative %: 74 %
Platelets: 254 10*3/uL (ref 150–440)
RBC: 3.9 MIL/uL (ref 3.80–5.20)
RDW: 13.9 % (ref 11.5–14.5)
WBC: 10.8 10*3/uL (ref 3.6–11.0)

## 2016-04-15 LAB — ETHANOL: Alcohol, Ethyl (B): <5 mg/dL (ref <5)

## 2016-04-15 LAB — TSH: TSH: 1.259 u[IU]/mL (ref 0.350–4.500)

## 2016-04-15 MED ORDER — CLOPIDOGREL BISULFATE 75 MG PO TABS: 75.0000 mg | ORAL_TABLET | Freq: Every day | ORAL | Status: DC

## 2016-04-15 MED ORDER — ROSUVASTATIN CALCIUM 5 MG PO TABS
5.0000 mg | ORAL_TABLET | Freq: Every day | ORAL | Status: DC
Start: 2016-04-15 — End: 2016-04-15

## 2016-04-15 MED ORDER — ENALAPRIL MALEATE 2.5 MG PO TABS: 2.5000 mg | ORAL_TABLET | Freq: Every day | ORAL | Status: DC

## 2016-04-15 MED ORDER — PERPHENAZINE 4 MG PO TABS
4.0000 mg | ORAL_TABLET | Freq: Two times a day (BID) | ORAL | Status: DC
  Administered 2016-04-15: 4 mg via ORAL
  Filled 2016-04-15: qty 1

## 2016-04-15 MED ORDER — ALBUTEROL SULFATE HFA 108 (90 BASE) MCG/ACT IN AERS: 2.0000 | INHALATION_SPRAY | RESPIRATORY_TRACT | Status: DC | PRN

## 2016-04-15 MED ORDER — ASPIRIN EC 81 MG PO TBEC: 81.0000 mg | DELAYED_RELEASE_TABLET | Freq: Every day | ORAL | Status: DC

## 2016-04-15 MED ORDER — CILOSTAZOL 50 MG PO TABS
50.0000 mg | ORAL_TABLET | Freq: Two times a day (BID) | ORAL | Status: DC
  Administered 2016-04-15: 50 mg via ORAL
  Filled 2016-04-15: qty 1

## 2016-04-15 MED ORDER — DILTIAZEM HCL ER COATED BEADS 180 MG PO CP24: 180.0000 mg | ORAL_CAPSULE | Freq: Every day | ORAL | Status: DC

## 2016-04-15 NOTE — ED Notes (Signed)
BEHAVIORAL HEALTH ROUNDING Patient sleeping: No. Patient alert and oriented: yes Behavior appropriate: Yes.  ; If no, describe:  Nutrition and fluids offered: yes Toileting and hygiene offered: Yes  Sitter present: q15 minute observations and security  monitoring Law enforcement present: Yes  ODS  

## 2016-04-15 NOTE — Consult Note (Signed)
Deanna [name] Psychiatry Consult   Reason for Consult:  Consult for this 65 year old woman sent here from her primary care doctor's office because of psychotic symptoms Referring Physician:  Edd [name] Patient Identification: Deanna [name] MRN:  [id number] Principal Diagnosis: Bipolar affective disorder, manic, severe, with psychotic behavior ([place]) Diagnosis:   Patient Active Problem List   Diagnosis Date Noted  . Diabetes mellitus without complication ([place]) [D32.6] 04/15/2016  . Kidney disease [N28.9] 04/15/2016  . COPD (chronic obstructive pulmonary disease) ([place]) [J44.9] 04/15/2016  . Hypertension [I10] 04/15/2016  . Coronary artery disease [I25.10] 04/15/2016  . Bipolar affective disorder, manic, severe, with psychotic behavior ([place]) [F31.2] 04/15/2016    Total Time spent with patient: 1 hour  Subjective:   Deanna [name] is a 65 y.o. female patient admitted with "I've got plenty to worry about".  HPI:  Patient interviewed. Chart reviewed. Labs and vitals reviewed. Case discussed with TTS. This 65 year old woman was sent here from her primary care doctor's office. She went there today for regular doctor's appointment but was exhibiting obviously paranoid psychotic behavior and referred to the emergency room. Patient admits that she has a history of bipolar disorder. She says that she has not been sleeping well probably for about 3 weeks. She also has been feeling nervous and frightened and paranoid. She talks a lot about how frightened she is at home about how people are trying to break into her house about how she has barricaded herself in the house. A lot of it is very vague. She seems to have only partial insight into the fact that she's not thinking clearly. Patient claims that she has been compliant with all of her medicine. She denies that she's using alcohol or drugs. She has been without a psychiatrist since Dr. Bridgett Larsson retired several months ago. Apparently her family became  concerned about her and called the police recently who came by her house to check on her but did not evidently see that she needed to be brought into the hospital. Patient denies that she's having any hallucinations. She denies any clear-cut recent stressor.  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 Takes multiple antiplatelet agents apparently but denies that she's ever had a heart attack.  Social history: Patient lives by herself in an apartment here in [place]. She's been here for about 4 years. During that time she was seeing Dr. Bridgett Larsson for treatment until he retired. She says she has 1 living child but she doesn't stay in touch with her. She says her closest relative is her cousin who lives in another city. Patient introduced herself to me by saying "I'm ." she talks quite a bit about her husband being murdered  Substance abuse history: Patient says she does not drink any alcohol though she admits that she had a problem with it in the distant past. She says she hasn't had any alcohol in 25 years. Denies any other drug use.  Past Psychiatric History: Patient evidently has been diagnosed with bipolar disorder for a long time probably decades. Her last hospitalization was about 4 years ago at a hospital in [place]. She says the only medicine that she can tolerate his perphenazine 4 mg twice a day which is her current medicine. She has had bad reactions to Tegretol and valproic acid and lithium in the past. She denies having ever tried to kill her self. Denies having been  violent.  Risk to Self: Is patient at risk for suicide?: No Risk to Others:   Prior Inpatient Therapy:   Prior Outpatient Therapy:    Past Medical History:  Past Medical History  Diagnosis Date  . Diabetes mellitus without complication ([place])   . Kidney disease   . COPD (chronic  obstructive pulmonary disease) ([place])   . GERD (gastroesophageal reflux disease)    History reviewed. No pertinent past surgical history. Family History: No family history on file. Family Psychiatric  History: Patient says that there is a family history of suicide attempts including on the part of her mother. She doesn't know her diagnosis. Social History:  History  Alcohol Use: Not on file     History  Drug Use Not on file    Social History   Social History  . Marital Status: Divorced    Spouse Name: N/A  . Number of Children: N/A  . Years of Education: N/A   Social History Main Topics  . Smoking status: Current Every Day Smoker  . Smokeless tobacco: None  . Alcohol Use: None  . Drug Use: None  . Sexual Activity: Not Asked   Other Topics Concern  . None   Social History Narrative  . None   Additional Social History:    Allergies:   Allergies  Allergen Reactions  . Carbamazepine Rash  . Sulfa Antibiotics Rash  . Victoza [Liraglutide] Rash    Labs:  Results for orders placed or performed during the hospital encounter of 04/15/16 (from the past 48 hour(s))  CBC with Differential     Status: Abnormal   Collection Time: 04/15/16 12:59 PM  Result Value Ref Range   WBC 10.8 3.6 - 11.0 K/uL   RBC 3.90 3.80 - 5.20 MIL/uL   Hemoglobin 12.7 12.0 - 16.0 g/dL   HCT 38.5 35.0 - 47.0 %   MCV 98.7 80.0 - 100.0 fL   MCH 32.5 26.0 - 34.0 pg   MCHC 32.9 32.0 - 36.0 g/dL   RDW 13.9 11.5 - 14.5 %   Platelets 254 150 - 440 K/uL   Neutrophils Relative % 74 %   Neutro Abs 8.1 (H) 1.4 - 6.5 K/uL   Lymphocytes Relative 18 %   Lymphs Abs 1.9 1.0 - 3.6 K/uL   Monocytes Relative 6 %   Monocytes Absolute 0.6 0.2 - 0.9 K/uL   Eosinophils Relative 1 %   Eosinophils Absolute 0.1 0 - 0.7 K/uL   Basophils Relative 1 %   Basophils Absolute 0.1 0 - 0.1 K/uL  Comprehensive metabolic panel     Status: Abnormal   Collection Time: 04/15/16 12:59 PM  Result Value Ref Range   Sodium 136  135 - 145 mmol/L   Potassium 3.2 (L) 3.5 - 5.1 mmol/L   Chloride 101 101 - 111 mmol/L   CO2 25 22 - 32 mmol/L   Glucose, Bld 121 (H) 65 - 99 mg/dL   BUN 14 6 - 20 mg/dL   Creatinine, Ser 1.48 (H) 0.44 - 1.00 mg/dL   Calcium 9.3 8.9 - 10.3 mg/dL   Total Protein 7.4 6.5 - 8.1 g/dL   Albumin 4.1 3.5 - 5.0 g/dL   AST 48 (H) 15 - 41 U/L   ALT 35 14 - 54 U/L   Alkaline Phosphatase 70 38 - 126 U/L   Total Bilirubin 0.5 0.3 - 1.2 mg/dL   GFR calc non Af Amer 37 (L) >60 mL/min   GFR calc Af Amer 43 (L) >  60 mL/min    Comment: (NOTE) The eGFR has been calculated using the CKD EPI equation. This calculation has not been validated in all clinical situations. eGFR's persistently <60 mL/min signify possible Chronic Kidney Disease.    Anion gap 10 5 - 15  Ethanol     Status: None   Collection Time: 04/15/16 12:59 PM  Result Value Ref Range   Alcohol, Ethyl (B) <5 <5 mg/dL    Comment:        LOWEST DETECTABLE LIMIT FOR SERUM ALCOHOL IS 5 mg/dL FOR MEDICAL PURPOSES ONLY   TSH     Status: None   Collection Time: 04/15/16 12:59 PM  Result Value Ref Range   TSH 1.259 0.350 - 4.500 uIU/mL    Current Facility-Administered Medications  Medication Dose Route Frequency Provider Last Rate Last Dose  . albuterol (PROVENTIL HFA;VENTOLIN HFA) 108 (90 Base) MCG/ACT inhaler 2 puff  2 puff Inhalation Q4H PRN Gonzella Lex, MD      . aspirin EC tablet 81 mg  81 mg Oral Daily Gonzella Lex, MD      . cilostazol (PLETAL) tablet 50 mg  50 mg Oral BID Gonzella Lex, MD      . clopidogrel (PLAVIX) tablet 75 mg  75 mg Oral Daily Gonzella Lex, MD      . diltiazem (CARDIZEM CD) 24 hr capsule 180 mg  180 mg Oral Daily John T Clapacs, MD      . enalapril (VASOTEC) tablet 2.5 mg  2.5 mg Oral Daily John T Clapacs, MD      . perphenazine (TRILAFON) tablet 4 mg  4 mg Oral BID Gonzella Lex, MD      . rosuvastatin (CRESTOR) tablet 5 mg  5 mg Oral q1800 Gonzella Lex, MD       No current outpatient  prescriptions on file.    Musculoskeletal: Strength & Muscle Tone: within normal limits Gait & Station: normal Patient leans: N/A  Psychiatric Specialty Exam: Review of Systems  Constitutional: Negative.   HENT: Negative.   Eyes: Negative.   Respiratory: Negative.   Cardiovascular: Negative.   Gastrointestinal: Negative.   Musculoskeletal: Negative.   Skin: Negative.   Neurological: Negative.   Psychiatric/Behavioral: Negative for depression, suicidal ideas, hallucinations, memory loss and substance abuse. The patient is nervous/anxious and has insomnia.     Blood pressure 130/72, pulse 89, temperature 98 F (36.7 C), temperature source Oral, resp. rate 18, height _0  (1.676 m), weight 77.111 kg (170 lb), SpO2 97 %.Body mass index is 27.45 kg/(m^2).  General Appearance: Casual  Eye Contact::  Fair  Speech:  Pressured  Volume:  Increased  Mood:  Euphoric and Irritable  Affect:  Labile  Thought Process:  Tangential  Orientation:  Full (Time, Place, and Person)  Thought Content:  Paranoid Ideation  Suicidal Thoughts:  No  Homicidal Thoughts:  No  Memory:  Immediate;   Good Recent;   Fair Remote;   Fair  Judgement:  Impaired  Insight:  Shallow  Psychomotor Activity:  Increased  Concentration:  Fair  Recall:  [employer] of Knowledge:Good  Language: Good  Akathisia:  No  Handed:  Right  AIMS (if indicated):     Assets:  Communication Skills Desire for Improvement Financial Resources/Insurance Housing Resilience Social Support  ADL's:  Intact  Cognition: WNL  Sleep:      Treatment Plan Summary: Daily contact with patient to assess and evaluate symptoms and progress in treatment, Medication management  and Plan 65 year old woman with a history of bipolar disorder who presents acutely psychotic. She is able to give some history although it requires waiting through a lot of tangential paranoid talk. Sounds like this is been going on for about 3 weeks. She insists that  she has been compliant with her medicine although the fact that her psychiatrist retired 6 months ago makes me suspicious as to whether she could've still been taking everything as prescribed. Although she is not currently reporting suicidal or homicidal ideation it sounds like her behavior has drawn the attention of the police and her neighbors and she is at risk for worsening mood and psychotic symptoms. Needs hospital level treatment. Patient is under IVC and will be admitted to psychiatry to our unit as soon as we can. Case reviewed with TTS. I have tried to use what medicine information I can find in the chart to restart some of her medicine. I can't find a record of what if anything she was taking for diabetes. We will manage her blood sugars closely probably will need a medicine consult. Continue the perphenazine for now.  Disposition: Recommend psychiatric Inpatient admission when medically cleared. Supportive therapy provided about ongoing stressors.  Deanna Berthold, MD 04/15/2016 5:15 PM

## 2016-04-15 NOTE — ED Notes (Signed)
Pt brought over from main ed, she is angry for being moved ,pt is to be adm ivc to [facility] med, she has no acute c/o of discomfort. She refused to go to his room

## 2016-04-15 NOTE — ED Notes (Signed)
TTS assessment is being completed at this time

## 2016-04-15 NOTE — ED Notes (Signed)
Report was received from Deanna Fredericksonuth B., RN; Pt. Verbalizes no complaints or distress; denies S.I./Hi. Patient presents with exhibiting manic behaviors; paranoia; psychosis; and was not caring for herself; when this writer went to spaek with patient; patient stated, "I'm not talking."  Continue to monitor with 15 min. Monitoring.

## 2016-04-15 NOTE — ED Provider Notes (Signed)
Time Seen: Approximately 1400  I have reviewed the triage notes  Chief Complaint: Manic Behavior   History of Present Illness: Deanna [name] is a 65 y.o. female who arrives after being at her PCPs office. Patient was transported here by EMS for concerns about her mental health. Patient is awake alert and oriented 3. She denies any suicidal thoughts, homicidal thoughts, hallucinations. She does have a tendency to ramble and talks of some paranoid delusions of people attempting to steal her house keys, etc. She denies any physical complaints other than some chronic arthritic-type discomfort. She states that she has been taking her medications which one of them includes perphenazine   Past Medical History  Diagnosis Date  . Diabetes mellitus without complication (HCC)   . Kidney disease   . COPD (chronic obstructive pulmonary disease) (HCC)   . GERD (gastroesophageal reflux disease)     There are no active problems to display for this patient.   History reviewed. No pertinent past surgical history.  History reviewed. No pertinent past surgical history.  No current outpatient prescriptions on file.  Allergies:  Carbamazepine; Sulfa antibiotics; and Victoza  Family History: No family history on file.  Social History: Social History  Substance Use Topics  . Smoking status: Current Every Day Smoker  . Smokeless tobacco: None  . Alcohol Use: None     Review of Systems:   10 point review of systems was performed and was otherwise negative:  Constitutional: No fever Eyes: No visual disturbances ENT: No sore throat, ear pain Cardiac: No chest pain Respiratory: No shortness of breath, wheezing, or stridor Abdomen: No abdominal pain, no vomiting, No diarrhea Endocrine: No weight loss, No night sweats Extremities: No peripheral edema, cyanosis Skin: No rashes, easy bruising Neurologic: No focal weakness, trouble with speech or swollowing Urologic: No dysuria,  Hematuria, or urinary frequency   Physical Exam:  ED Triage Vitals  Enc Vitals Group     BP 04/15/16 1253 175/132 mmHg     Pulse Rate 04/15/16 1253 108     Resp 04/15/16 1253 20     Temp 04/15/16 1253 98 F (36.7 C)     Temp Source 04/15/16 1253 Oral     SpO2 04/15/16 1253 99 %     Weight 04/15/16 1253 170 lb (77.111 kg)     Height 04/15/16 1253  (1.676 m)     Head Cir --      Peak Flow --      Pain Score --      Pain Loc --      Pain Edu? --      Excl. in GC? --     General: Awake , Alert , and Oriented times 3; GCS 15 Head: Normal cephalic , atraumatic Eyes: Pupils equal , round, reactive to light Nose/Throat: No nasal drainage, patent upper airway without erythema or exudate.  Neck: Supple, Full range of motion, No anterior adenopathy or palpable thyroid masses Lungs: Clear to ascultation without wheezes , rhonchi, or rales Heart: Regular rate, regular rhythm without murmurs , gallops , or rubs Abdomen: Soft, non tender without rebound, guarding , or rigidity; bowel sounds positive and symmetric in all 4 quadrants. No organomegaly .        Extremities: 2 plus symmetric pulses. No edema, clubbing or cyanosis Neurologic: normal ambulation, Motor symmetric without deficits, sensory intact Skin: warm, dry, no rashes   Labs:   All laboratory work was reviewed including any pertinent negatives or positives  listed below:  Labs Reviewed  CBC WITH DIFFERENTIAL/PLATELET - Abnormal; Notable for the following:    Neutro Abs 8.1 (*)    All other components within normal limits  COMPREHENSIVE METABOLIC PANEL - Abnormal; Notable for the following:    Potassium 3.2 (*)    Glucose, Bld 121 (*)    Creatinine, Ser 1.48 (*)    AST 48 (*)    GFR calc non Af Amer 37 (*)    GFR calc Af Amer 43 (*)    All other components within normal limits  ETHANOL  URINALYSIS COMPLETEWITH MICROSCOPIC ([facility] ONLY)  TSH    ED Course:  Patient has TTS and psychiatric consultation  established. I made the patient and IVC Rennis Hardingllis concerned that she would leave without talking to the psychiatrist. As stated in my note she is not suicidal, homicidal, or having any obvious auditory or physical hallucinations though she does express some multiple occasions some paranoid thoughts.*    Assessment:  Paranoid delusions      Plan:  Psychiatric consultation            Jennye MoccasinBrian S Quigley, MD 04/15/16 [phone]

## 2016-04-15 NOTE — ED Notes (Signed)
Report called to Ruthie RN  Pt may transfer at this time

## 2016-04-15 NOTE — BH Assessment (Addendum)
Tele Assessment Note   Deanna CeaDeborah K Parcel is a 65 y.o. female who [facility] from her primary care doctor due to odd behavior. Pt denies SI/HI/AVH, but her speech is tangential and paranoid in nature. it appears that her behavior has drawn the attention of the police and her neighbors and she is at risk for worsening mood and psychotic symptoms.   Diagnosis: Bipolar I disorder, Current or most recent episode manic, With psychotic features  Past Medical History:  Past Medical History  Diagnosis Date  . Diabetes mellitus without complication (HCC)   . Kidney disease   . COPD (chronic obstructive pulmonary disease) (HCC)   . GERD (gastroesophageal reflux disease)     History reviewed. No pertinent past surgical history.  Family History: No family history on file.  Social History:  reports that she has been smoking.  She does not have any smokeless tobacco history on file. Her alcohol and drug histories are not on file.  Additional Social History:  Alcohol / Drug Use Pain Medications: see PTA meds Prescriptions: see PTA meds Over the Counter: see PTA meds History of alcohol / drug use?: Yes (reports hadn't had a drinking issue in 25 yrs)  CIWA: CIWA-Ar BP: 130/72 mmHg Pulse Rate: 89 COWS:    PATIENT STRENGTHS: (choose at least two) Average or above average intelligence Capable of independent living Financial means  Allergies:  Allergies  Allergen Reactions  . Carbamazepine Rash  . Sulfa Antibiotics Rash  . Victoza [Liraglutide] Rash    Home Medications:  (Not in a hospital admission)  OB/GYN Status:  No LMP recorded. Patient is postmenopausal.  General Assessment Data [facility] TTS Assessment: In system Is this a Tele or Face-to-Face Assessment?: Tele Assessment Is this an Initial Assessment or a Re-assessment for this encounter?: Initial Assessment Marital status: Divorced Is patient pregnant?: No Pregnancy Status:  No Living Arrangements: Alone Can pt return to current living arrangement?: Yes Admission Status: Involuntary Is patient capable of signing voluntary admission?: Yes Referral Source: MD [facility]  Medical Screening Exam [facility] Walk-in ONLY) Medical Exam completed: Yes  Crisis Care Plan Living Arrangements: Alone Name of Psychiatrist: none Name of Therapist: none  Education Status Is patient currently in school?: No  Risk to self with the past 6 months Suicidal Ideation: No Has patient been a risk to self within the past 6 months prior to admission? : No Suicidal Intent: No Has patient had any suicidal intent within the past 6 months prior to admission? : No Is patient at risk for suicide?: No Suicidal Plan?: No Has patient had any suicidal plan within the past 6 months prior to admission? : No Access to Means: No What has been your use of drugs/alcohol within the last 12 months?: no use Previous Attempts/Gestures: No How many times?: 0 Other Self Harm Risks: 0 Triggers for Past Attempts: Other (Comment) (no past attempts) Intentional Self Injurious Behavior: None Family Suicide History: Unknown Recent stressful life event(s): Other (Comment) (unspecified) Persecutory voices/beliefs?: No Depression: No Depression Symptoms: Feeling angry/irritable Substance abuse history and/or treatment for substance abuse?: No Suicide prevention information given to non-admitted patients: Not applicable  Risk to Others within the past 6 months Homicidal Ideation: No Does patient have any lifetime risk of violence toward others beyond the six months prior to admission? : No Thoughts of Harm to Others: No Current Homicidal Intent: No Current Homicidal Plan: No Access to Homicidal Means: No History of harm  to others?: No Assessment of Violence: None Noted Violent Behavior Description: none noted Does patient have access to weapons?: No Criminal Charges Pending?: No Does  patient have a court date: No Is patient on probation?: No  Psychosis Hallucinations: None noted Delusions: Unspecified  Mental Status Report Appearance/Hygiene: Unremarkable Eye Contact: Fair Motor Activity: Unremarkable Speech: Unremarkable Level of Consciousness: Alert, Irritable Mood: Irritable Affect: Irritable Anxiety Level: Minimal Thought Processes: Tangential Judgement: Unable to Assess Orientation: Person, Place Obsessive Compulsive Thoughts/Behaviors: Unable to Assess  Cognitive Functioning Concentration: Unable to Assess Memory: Unable to Assess IQ: Average Insight: Unable to Assess Impulse Control: Unable to Assess Appetite: Good Sleep: No Change Vegetative Symptoms: None  ADLScreening [facility]) Patient's cognitive ability adequate to safely complete daily activities?: Yes Patient able to express need for assistance with ADLs?: Yes Independently performs ADLs?: Yes (appropriate for developmental age)  Prior Inpatient Therapy Prior Inpatient Therapy: Yes Prior Therapy Dates: unknown Prior Therapy Facilty/Provider(s): unknown Reason for Treatment: unknown  Prior Outpatient Therapy Prior Outpatient Therapy: Yes Prior Therapy Dates: unknown Prior Therapy Facilty/Provider(s): unknown Reason for Treatment: unknown Does patient have an ACCT team?: No Does patient have Intensive In-House Services?  : No Does patient have [facility] services? : No Does patient have P4CC services?: No  ADL Screening (condition at time of admission) Patient's cognitive ability adequate to safely complete daily activities?: Yes Is the patient deaf or have difficulty hearing?: No Does the patient have difficulty seeing, even when wearing glasses/contacts?: No Does the patient have difficulty concentrating, remembering, or making decisions?: No Patient able to express need for assistance with ADLs?: Yes Does the patient have difficulty dressing or bathing?:  No Independently performs ADLs?: Yes (appropriate for developmental age) Does the patient have difficulty walking or climbing stairs?: No Weakness of Legs: None Weakness of Arms/Hands: None  Home Assistive Devices/Equipment Home Assistive Devices/Equipment: None  Therapy Consults (therapy consults require a physician order) PT Evaluation Needed: No OT Evalulation Needed: No SLP Evaluation Needed: No Abuse/Neglect Assessment (Assessment to be complete while patient is alone) Physical Abuse:  (UTA) Verbal Abuse:  (UTA) Sexual Abuse:  (UTA) Exploitation of patient/patient's resources: Denies Self-Neglect: Denies Values / Beliefs Cultural Requests During Hospitalization: None Spiritual Requests During Hospitalization: None Consults Spiritual Care Consult Needed: No Social Work Consult Needed: No Merchant navy officer (For Healthcare) Does patient have an advance directive?: No Would patient like information on creating an advanced directive?: No - patient declined information    Additional Information 1:1 In Past 12 Months?: No CIRT Risk: No Elopement Risk: No Does patient have medical clearance?: Yes     Disposition:  Disposition Initial Assessment Completed for this Encounter: Yes Disposition of Patient: Inpatient treatment program (consulted with Dr. Tamsen Snider) Type of inpatient treatment program: Adult (pt to be accepted to [facility] on next shift)  Laddie Aquas 04/15/2016 5:59 PM

## 2016-04-15 NOTE — ED Notes (Signed)
She can be heard  "I don't have anything to hide in my car - I think this is stupid - I am not crazy  - I want somebody to [id number] - this is a spelling test today."

## 2016-04-15 NOTE — ED Notes (Signed)
Supper provided  

## 2016-04-15 NOTE — ED Notes (Signed)
Pt ambulates without difficulty

## 2016-04-15 NOTE — ED Notes (Signed)
Pt presents to [facility] angry  - apparently she visited her PCP today and while she was there the PCP called EMS to bring her here for a behavioral eval   Pt reports "I do not have a key to my front door - everytime that I leave home for just10-20 minutes then  I come home and something is missing or has been stolen .  The only thing in my car is mine - I do not have anything in my car  - it is at the doctors office."

## 2016-04-15 NOTE — ED Notes (Signed)

## 2016-04-15 NOTE — ED Notes (Addendum)
Pt arrived to triage, ambulatory via EMS.  EMS states pt was at Dr Rosine BeatKhans office and they called EMS to bring her to ED to get behavioral evaluation. Pt alert and talkative in sentence fragments that do not make sense

## 2016-04-16 DIAGNOSIS — F17200 Nicotine dependence, unspecified, uncomplicated: Secondary | ICD-10-CM | POA: Diagnosis present

## 2016-04-16 DIAGNOSIS — F312 Bipolar disorder, current episode manic severe with psychotic features: Secondary | ICD-10-CM | POA: Diagnosis present

## 2016-04-16 LAB — GLUCOSE, CAPILLARY
Glucose-Capillary: 97 mg/dL (ref 65–99)
Glucose-Capillary: 98 mg/dL (ref 65–99)

## 2016-04-16 LAB — URINE DRUG SCREEN, QUALITATIVE (ARMC ONLY)
Amphetamines, Ur Screen: NOT DETECTED
Barbiturates, Ur Screen: NOT DETECTED
Benzodiazepine, Ur Scrn: NOT DETECTED
Cannabinoid 50 Ng, Ur ~~LOC~~: POSITIVE — AB
Cocaine Metabolite,Ur ~~LOC~~: NOT DETECTED
MDMA (Ecstasy)Ur Screen: NOT DETECTED
Methadone Scn, Ur: NOT DETECTED
Opiate, Ur Screen: NOT DETECTED
Phencyclidine (PCP) Ur S: NOT DETECTED
Tricyclic, Ur Screen: NOT DETECTED

## 2016-04-16 LAB — URINALYSIS COMPLETE WITH MICROSCOPIC (ARMC ONLY)
Bilirubin Urine: NEGATIVE
Glucose, UA: NEGATIVE mg/dL
Hgb urine dipstick: NEGATIVE
Ketones, ur: NEGATIVE mg/dL
Leukocytes, UA: NEGATIVE
Nitrite: NEGATIVE
Protein, ur: NEGATIVE mg/dL
RBC / HPF: NONE SEEN RBC/hpf (ref 0–5)
Specific Gravity, Urine: 1.002 — ABNORMAL LOW (ref 1.005–1.030)
pH: 6 (ref 5.0–8.0)

## 2016-04-16 LAB — LIPID PANEL
Cholesterol: 146 mg/dL (ref 0–200)
HDL: 58 mg/dL (ref >40)
LDL Cholesterol: 68 mg/dL (ref 0–99)
Total CHOL/HDL Ratio: 2.5 RATIO
Triglycerides: 100 mg/dL (ref <150)
VLDL: 20 mg/dL (ref 0–40)

## 2016-04-16 LAB — TSH: TSH: 1.825 u[IU]/mL (ref 0.350–4.500)

## 2016-04-16 LAB — HEMOGLOBIN A1C: Hgb A1c MFr Bld: 5.8 % (ref 4.0–6.0)

## 2016-04-16 MED ORDER — ALBUTEROL SULFATE (2.5 MG/3ML) 0.083% IN NEBU: 3.0000 mL | INHALATION_SOLUTION | RESPIRATORY_TRACT | Status: DC | PRN

## 2016-04-16 MED ORDER — CLOPIDOGREL BISULFATE 75 MG PO TABS
75.0000 mg | ORAL_TABLET | Freq: Every day | ORAL | Status: DC
  Administered 2016-04-16 – 2016-04-25 (×5): 75 mg via ORAL
  Filled 2016-04-16 (×9): qty 1

## 2016-04-16 MED ORDER — CILOSTAZOL 100 MG PO TABS
50.0000 mg | ORAL_TABLET | Freq: Two times a day (BID) | ORAL | Status: DC
Start: 2016-04-16 — End: 2016-04-25
  Administered 2016-04-16 – 2016-04-20 (×5): 50 mg via ORAL
  Administered 2016-04-21: 100 mg via ORAL
  Administered 2016-04-22 – 2016-04-24 (×4): 50 mg via ORAL
  Filled 2016-04-16 (×5): qty 1
  Filled 2016-04-16 (×2): qty 2
  Filled 2016-04-16 (×6): qty 1
  Filled 2016-04-16 (×2): qty 2
  Filled 2016-04-16 (×3): qty 1

## 2016-04-16 MED ORDER — ROSUVASTATIN CALCIUM 5 MG PO TABS
5.0000 mg | ORAL_TABLET | Freq: Every day | ORAL | Status: DC
  Administered 2016-04-16 – 2016-04-18 (×3): 5 mg via ORAL
  Filled 2016-04-16 (×4): qty 1

## 2016-04-16 MED ORDER — DILTIAZEM HCL ER COATED BEADS 180 MG PO CP24
180.0000 mg | ORAL_CAPSULE | Freq: Every day | ORAL | Status: DC
  Administered 2016-04-16: 180 mg via ORAL
  Filled 2016-04-16 (×5): qty 1

## 2016-04-16 MED ORDER — MAGNESIUM HYDROXIDE 400 MG/5ML PO SUSP: 30.0000 mL | Freq: Every day | ORAL | Status: DC | PRN

## 2016-04-16 MED ORDER — INSULIN ASPART 100 UNIT/ML ~~LOC~~ SOLN
0.0000 [IU] | Freq: Three times a day (TID) | SUBCUTANEOUS | Status: DC
  Filled 2016-04-16: qty 2

## 2016-04-16 MED ORDER — ACETAMINOPHEN 325 MG PO TABS: 650.0000 mg | ORAL_TABLET | Freq: Four times a day (QID) | ORAL | Status: DC | PRN

## 2016-04-16 MED ORDER — ENALAPRIL MALEATE 2.5 MG PO TABS
2.5000 mg | ORAL_TABLET | Freq: Every day | ORAL | Status: DC
  Administered 2016-04-16 – 2016-04-25 (×5): 2.5 mg via ORAL
  Filled 2016-04-16 (×10): qty 1

## 2016-04-16 MED ORDER — ASPIRIN EC 81 MG PO TBEC
81.0000 mg | DELAYED_RELEASE_TABLET | Freq: Every day | ORAL | Status: DC
  Administered 2016-04-16 – 2016-04-25 (×7): 81 mg via ORAL
  Filled 2016-04-16 (×9): qty 1

## 2016-04-16 MED ORDER — PERPHENAZINE 4 MG PO TABS
4.0000 mg | ORAL_TABLET | Freq: Two times a day (BID) | ORAL | Status: DC
  Administered 2016-04-16 – 2016-04-18 (×4): 4 mg via ORAL
  Filled 2016-04-16 (×6): qty 1

## 2016-04-16 MED ORDER — ALUM & MAG HYDROXIDE-SIMETH 200-200-20 MG/5ML PO SUSP: 30.0000 mL | ORAL | Status: DC | PRN

## 2016-04-16 MED ORDER — TRAZODONE HCL 100 MG PO TABS
100.0000 mg | ORAL_TABLET | Freq: Every day | ORAL | Status: DC
  Administered 2016-04-16: 100 mg via ORAL
  Filled 2016-04-16: qty 1
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
## 2016-04-16 NOTE — Progress Notes (Signed)
Admission Note:  [age] yr female, presents IVC in no acute distress for the treatment of Paranoia with psychotic behavior, Depression and Bipolar disorder. Pt appears flat and depressed. Pt was irritable, hostile and verbally aggressive during admission process. Pt denies SI and contracts for safety upon admission. Pt denies AVH , but noted responding to internal stimuli. Pt has Past medical Hx of Depression, DM, Bipolar, COPD, HTN, and heart disease. Patient's CBG was 100 upon admission. Pt lives alone, endorsed that her depression has worsened, also that she feels more paranoid thinking people are going to break into her house. Pt says she is on disability. Skin was assessed and found to have a bruise on her left arm, in addition, her left great toe appears to have an ingrown nail, darkened and swollen, pedal pulse palpable. Patient was searched and no contraband found, POC and unit policies explained and understanding verbalized. Consents obtained. Food and fluids offered, and fluids accepted. Pt had no additional questions or concerns.Patient is not receptive to treatment, 15 minutes checks maintained will continue to closely monitor.

## 2016-04-16 NOTE — Clinical Social Work Note (Signed)
Clinical Social Work Assessment  Patient Details  Name: Deanna [name] MRN: [id number] Date of Birth: [birth year]  CSW met with patient as patietn was concerned about her apartment door being unlocked adn called with patient to ask maintenance at her apartment to check on her unlocked door and they agreed to do so. CSW also called with patient to speak with her primary care agency where she left her car before coming to the hopsital and staff agreed to leave car in parking lot and not to tow it so patient could pick it up at discharge.  Keene Breath, LCSW 04/16/2016, 3:40 PM

## 2016-04-16 NOTE — Tx Team (Signed)
Initial Interdisciplinary Treatment Plan   PATIENT STRESSORS: Health problems Medication change or noncompliance   PATIENT STRENGTHS: Capable of independent living Communication skills   PROBLEM LIST: Problem List/Patient Goals Date to be addressed Date deferred Reason deferred Estimated date of resolution  Bipolar  4/26           Paranoia 4/26                                          DISCHARGE CRITERIA:  Ability to meet basic life and health needs Adequate post-discharge living arrangements Improved stabilization in mood, thinking, and/or behavior Motivation to continue treatment in a less acute level of care  PRELIMINARY DISCHARGE PLAN: Outpatient therapy Return to previous living arrangement  PATIENT/FAMIILY INVOLVEMENT: This treatment plan has been presented to and reviewed with the patient, Deanna [name], and/or family member, .  The patient and family have been given the opportunity to ask questions and make suggestions.  Ignacia FellingJennifer A Maryna Yeagle 04/16/2016, 11:31 AM

## 2016-04-16 NOTE — Progress Notes (Addendum)
Patient with depressed affect, irritable and intrusive. Patient angry when she speaks and is noted talking to no one in the hall frequently. No SI/HI at this time. Appears to be responding to internal stimuli. Suspicious and demanding at times. Takes meds, minimal interaction with peers. Safety maintained. Patient remain irritable and paces halls. Patient appears to be talking to another person, in loud angry voice. Requested urine sample and no sample provided at this time. Tangential. Urine sample obtained with results pending.

## 2016-04-16 NOTE — Plan of Care (Signed)
Problem: Alteration in thought process Goal: LTG-Patient behavior demonstrates decreased signs psychosis (Patient behavior demonstrates decreased signs of psychosis to the point the patient is safe to return home and continue treatment in an outpatient setting.)  Outcome: Not Progressing Patient demonstrate psychosis.      

## 2016-04-16 NOTE — Plan of Care (Signed)
Problem: Consults Goal: [facility] General Treatment Patient Education Outcome: Progressing Patient cooperative with meds and plan of care.     
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
## 2016-04-16 NOTE — [facility] Suicide Risk Assessment (Signed)
[facility]   Nursing information obtained from:  Patient Demographic factors:  Caucasian, Adolescent or young adult, Living alone, Unemployed Current Mental Status:  NA Loss Factors:  Decline in physical health, Financial problems / change in socioeconomic status Historical Factors:  Family history of mental illness or substance abuse Risk Reduction Factors:  NA  Total Time spent with patient: 1 hour Principal Problem: Bipolar I disorder, most recent episode manic, severe with psychotic features (HCC) Diagnosis:   Patient Active Problem List   Diagnosis Date Noted  . Bipolar I disorder, most recent episode manic, severe with psychotic features (HCC) [F31.2] 04/16/2016  . Tobacco use disorder [F17.200] 04/16/2016  . Diabetes mellitus without complication (HCC) [E11.9] 04/15/2016  . Kidney disease [N28.9] 04/15/2016  . COPD (chronic obstructive pulmonary disease) (HCC) [J44.9] 04/15/2016  . Hypertension [I10] 04/15/2016  . Coronary artery disease [I25.10] 04/15/2016   Subjective Data: Paranoia, insomnia.  Continued Clinical Symptoms:  Alcohol Use Disorder Identification Test Final Score (AUDIT): 0 The "Alcohol Use Disorders Identification Test", Guidelines for Use in Primary Care, Second Edition.  World Science writerHealth Organization [facility](WHO). Score between 0-7:  no or low risk or alcohol related problems. Score between 8-15:  moderate risk of alcohol related problems. Score between 16-19:  high risk of alcohol related problems. Score 20 or above:  warrants further diagnostic evaluation for alcohol dependence and treatment.   CLINICAL FACTORS:   Bipolar Disorder:   Mixed State   Musculoskeletal: Strength & Muscle Tone: within normal limits Gait & Station: normal Patient leans: N/A  Psychiatric Specialty Exam: Review of Systems  Psychiatric/Behavioral: Positive for hallucinations. The patient has insomnia.   All other systems reviewed and are negative.    Blood pressure 152/76, pulse 87, temperature 98 F (36.7 C), temperature source Oral, resp. rate 18, height 5' 3.98" (1.625 m), weight 68.04 kg (150 lb), SpO2 99 %.Body mass index is 25.77 kg/(m^2).  General Appearance: Fairly Groomed  Patent attorneyye Contact::  Fair  Speech:  Clear and Coherent  Volume:  Normal  Mood:  Angry, Dysphoric and Irritable  Affect:  Labile  Thought Process:  Disorganized  Orientation:  Full (Time, Place, and Person)  Thought Content:  Delusions and Paranoid Ideation  Suicidal Thoughts:  No  Homicidal Thoughts:  No  Memory:  Immediate;   Fair Recent;   Fair Remote;   Fair  Judgement:  Poor  Insight:  Lacking  Psychomotor Activity:  Normal  Concentration:  Fair  Recall:  FiservFair  Fund of Knowledge:Fair  Language: Fair  Akathisia:  No  Handed:  Right  AIMS (if indicated):     Assets:  Communication Skills Desire for Improvement Financial Resources/Insurance Housing Physical Health Resilience  Sleep:  Number of Hours: 3.5  Cognition: WNL  ADL's:  Intact    COGNITIVE FEATURES THAT CONTRIBUTE TO RISK:  Thought constriction (tunnel vision)    SUICIDE RISK:   Moderate:  Frequent suicidal ideation with limited intensity, and duration, some specificity in terms of plans, no associated intent, good self-control, limited dysphoria/symptomatology, some risk factors present, and identifiable protective factors, including available and accessible social support.  PLAN OF CARE: Hospital admission, medication management, discharge planning.  Ms. Deanna [name] is a 65 year old female with a history of bipolar disorder admitted in a manic psychotic episode in the context of treatment noncompliance.  1. Psychosis. She was restarted on perphenazine.  2. Insomnia. We will offer trazodone.  3. Smoking. Nicotine patch is available.  4. Hypertension. She is on Cardizem and  enalapril.  5. Coronary artery disease. She is on aspirin, Plavix, Crestor, and Pletal.  6. Diabetes.  She is taking to Trulicity at home. We will start sliding scale insulin, blood glucose monitoring, ADA diet.  7. COPD. She is on inhaler.  8. Metabolic syndrome monitoring. Lipid profile, hemoglobin A1c, TSH, and prolactin are pending.  9. Disposition. She will be discharged to home. She needs a follow-up appointment with a new psychiatrist.  I certify that inpatient services furnished can reasonably be expected to improve the patient's condition.   Kristine Linea, MD 04/16/2016, 11:18 AM
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
## 2016-04-16 NOTE — [facility] Group Notes (Signed)
[facility] LCSW Group Therapy  04/16/2016 3:07 PM  Type of Therapy:  Group Therapy  Participation Level:  Active  Participation Quality:  Intrusive and Monopolizing  Affect:  Irritable and Labile  Cognitive:  Disorganized  Insight:  Limited  Engagement in Therapy:  Distracting, Monopolizing and Off Topic  Modes of Intervention:  Discussion, Education, Socialization and Support  Summary of Progress/Problems:Pt attended and participated in group discussion of emotion regulation skills.  Pt frequently off topic talking about celebrity Bertha StakesCharlie Sheen.  Labile, requiring redirection frequently back to group topic.  She shares about feeling disrespected.  Glennon MacLaws, Tu Shimmel P, MSW, LCSW 04/16/2016, 3:07 PM

## 2016-04-16 NOTE — BHH Counselor (Signed)
Adult Comprehensive Assessment  Patient ID: Deanna CeaDeborah K Zuercher, female   DOB: 03/07/1951, 65 y.o.   MRN: [id number]  Information Source:    Current Stressors:     Living/Environment/Situation:  Living Arrangements: Alone ([address] [place], [place] door was left unlocked) What is atmosphere in current home: Comfortable  Family History:  Marital status: Divorced Does patient have children?:  ("too personal, too close to the heart" )  Childhood History:     Education:  Currently a Consulting civil engineerstudent?: No Learning disability?: No  Employment/Work Situation:   Employment situation: On disability Why is patient on disability: mental health Has patient ever been in the Eli Lilly and Companymilitary?: No Has patient ever served in combat?: No Did You Receive Any Psychiatric Treatment/Services While in Equities traderthe Military?: No Are There Guns or Other Weapons in Your Home?: No Are These ComptrollerWeapons Safely Secured?:  (n/a)  Financial Resources:   Financial resources: Insurance claims handlereceives SSDI, Medicaid Does patient have a Lawyerrepresentative payee or guardian?: No  Alcohol/Substance Abuse:   What has been your use of drugs/alcohol within the last 12 months?: denies Alcohol/Substance Abuse Treatment Hx: Denies past history  Social Support System:   Forensic psychologistatient's Community Support System: Fair Museum/gallery exhibitions officerDescribe Community Support System: cousin  Leisure/Recreation:      Strengths/Needs:      Discharge Plan:   Does patient have access to transportation?: No Plan for no access to transportation at discharge: may need assistance Will patient be returning to same living situation after discharge?: Yes Currently receiving community mental health services: No If no, would patient like referral for services when discharged?: Yes (What county?) [facility]([place], patient use to see Dr. Claudie Fishermanhin with [facility]) Does patient have financial barriers related to discharge medications?: No  Summary/Recommendations:    Summary and Recommendations (to be completed by the evaluator): Patient is a divorced 65 year old Caucasian female admitted with a diagnosis of Bipolar I disorder, most recent episode manic, severe with psychotic features . Patient is on disability and has Medicaid and Medicare and lives in an apartment in [place]. Patient presented to the hopital with disorganized thoughts and was labile. Patient has little insight at time of assessment. Patient was concerned about her apartment being locked and her car parked at her PCP office and CSW made calls with patient to have maintenance lock patient's apartment door and PCP will allow car to sit in the parking lot till patient can discharge or make other arrangements. Patient will discharge home once stabilized and need [place] [facility] [place]. Patient will benefit from crisis stabilization, medication evaluation, group therapy and psycho education in addition to  case management for discharge planning. At discharge, it is recommended that patient remain compliant with established discharge plan and continued treatment.  Lulu RidingIngle, Jarmaine Ehrler T., MSW, Alexander MtLCSW 04/16/2016  [phone]

## 2016-04-17 LAB — GLUCOSE, CAPILLARY
Glucose-Capillary: 141 mg/dL — ABNORMAL HIGH (ref 65–99)
Glucose-Capillary: 100 mg/dL — ABNORMAL HIGH (ref 65–99)
Glucose-Capillary: 125 mg/dL — ABNORMAL HIGH (ref 65–99)

## 2016-04-17 LAB — PROLACTIN: Prolactin: 8.3 ng/mL (ref 4.8–23.3)

## 2016-04-17 MED ORDER — TEMAZEPAM 15 MG PO CAPS
15.0000 mg | ORAL_CAPSULE | Freq: Every day | ORAL | Status: DC
  Administered 2016-04-19 – 2016-04-23 (×3): 15 mg via ORAL
  Filled 2016-04-17 (×8): qty 1

## 2016-04-17 MED ORDER — FLUPHENAZINE HCL 2.5 MG/ML IJ SOLN
5.0000 mg | Freq: Two times a day (BID) | INTRAMUSCULAR | Status: DC
  Filled 2016-04-17 (×8): qty 2

## 2016-04-17 NOTE — Plan of Care (Signed)
Problem: Ineffective individual coping Goal: LTG: Patient will report a decrease in negative feelings Outcome: Not Progressing Patient does not report a decrease in negative feelings this shift

## 2016-04-17 NOTE — Progress Notes (Signed)
Recreation Therapy Notes  Date: 04.27.17 Time: 9:30 am Location: Craft Room  Group Topic: Leisure Education  Goal Area(s) Addresses:  Patient will identify activities for each letter of the alphabet. Patient will verbalize emotion felt when participating in activities.  Behavioral Response: Attentive, Interactive, Off topic  Intervention: Leisure Alphabet  Activity: Patients were given an Leisure Information systems managerAlphabet worksheet and as a group picked leisure activities for each letter of the alphabet.  Education: LRT educated patients on why leisure is important.  Education Outcome: In group clarification offered  Clinical Observations/Feedback: Patient completed activity by writing healthy leisure activities and words down. Patient contributed to group discussion by stating healthy leisure activities and words. When laser tag was mentioned as an activity, patient stated it was dangerous because it messes with the airlines.  Jacquelynn CreeGreene,Antrice Pal M, LRT/CTRS 04/17/2016 10:24 AM

## 2016-04-17 NOTE — BHH Group Notes (Signed)
BHH LCSW Group Therapy  04/17/2016 3:14 PM  Type of Therapy:  Group Therapy  Participation Level:  Active  Participation Quality:  Intrusive  Affect:  Labile  Cognitive:  Disorganized  Insight:  Limited  Engagement in Therapy:  Limited  Modes of Intervention:  Discussion, Education, Socialization and Support  Summary of Progress/Problems: Balance in life: Patients will discuss the concept of balance and how it looks and feels to be unbalanced. Pt will identify areas in their life that is unbalanced and ways to become more balanced.  Pt attended group and stayed most of the time. She was disorganized and irritable throughout group. She struggled to stay on topic and was difficult to redirect. She discussed having no support but told many stories about cutting people out of her life. She states "they were not thinking about my needs." She became angry when another patient discussed her family because "I don't have one."    Sempra EnergyCandace L Lashawn Orrego MSW, LCSWA  04/17/2016, 3:14 PM

## 2016-04-17 NOTE — Progress Notes (Addendum)
D: Pt denies SI/HI/AVH, but noted responding to internal stimuli, Pt is irritable, angry and hostile towards staff. Patient is not receptive to care not willing to participate in treatment plan. Pt appears less anxious but, she is no interacting with peers and staff appropriately.  A: Pt was offered support and encouragement. Pt was given scheduled medications. Pt was encouraged to attend groups. Q 15 minute checks were done for safety.  R:Pt did not attend group.  Pt is taking medication. Pt is not receptive to treatment, safety maintained on unit.

## 2016-04-17 NOTE — Progress Notes (Signed)
[facility] MD Progress Note  04/17/2016 2:15 PM Deanna [name]  MRN:  [id number]  Subjective: Deanna [name] is still very disorganized but in addition agitated and argumentative. She refused medications today. She insists to be discharged to drive home to take her Trulicity insulin. She met with treatment team today but was unreasonable and unable to participate in discharge planning.  Principal Problem: Bipolar I disorder, most recent episode manic, severe with psychotic features ([place]) Diagnosis:   Patient Active Problem List   Diagnosis Date Noted  . Bipolar I disorder, most recent episode manic, severe with psychotic features ([place]) [F31.2] 04/16/2016  . Tobacco use disorder [F17.200] 04/16/2016  . Diabetes mellitus without complication ([place]) [L46.5] 04/15/2016  . Kidney disease [N28.9] 04/15/2016  . COPD (chronic obstructive pulmonary disease) ([place]) [J44.9] 04/15/2016  . Hypertension [I10] 04/15/2016  . Coronary artery disease [I25.10] 04/15/2016   Total Time spent with patient: 20 minutes  Past Psychiatric History: Bipolar disorder.  Past Medical History:  Past Medical History  Diagnosis Date  . Diabetes mellitus without complication ([place])   . Kidney disease   . COPD (chronic obstructive pulmonary disease) ([place])   . GERD (gastroesophageal reflux disease)    History reviewed. No pertinent past surgical history. Family History: History reviewed. No pertinent family history. Family Psychiatric  History: Bipolar disorder. Social History:  History  Alcohol Use No     History  Drug Use No    Social History   Social History  . Marital Status: Divorced    Spouse Name: N/A  . Number of Children: N/A  . Years of Education: N/A   Social History Main Topics  . Smoking status: Current Every Day Smoker -- 0.50 packs/day for 40 years    Types: Cigarettes  . Smokeless tobacco: None  . Alcohol Use: No  . Drug Use: No  . Sexual Activity: Not Currently    Birth Control/ Protection:  None   Other Topics Concern  . None   Social History Narrative   Additional Social History:                         Sleep: Fair  Appetite:  Fair  Current Medications: Current Facility-Administered Medications  Medication Dose Route Frequency Provider Last Rate Last Dose  . acetaminophen (TYLENOL) tablet 650 mg  650 mg Oral Q6H PRN Gonzella Lex, MD      . albuterol (PROVENTIL) (2.5 MG/3ML) 0.083% nebulizer solution 3 mL  3 mL Inhalation Q4H PRN Gonzella Lex, MD      . alum & mag hydroxide-simeth (MAALOX/MYLANTA) 200-200-20 MG/5ML suspension 30 mL  30 mL Oral Q4H PRN Gonzella Lex, MD      . aspirin EC tablet 81 mg  81 mg Oral Daily Gonzella Lex, MD   81 mg at 04/16/16 0908  . cilostazol (PLETAL) tablet 50 mg  50 mg Oral BID Gonzella Lex, MD   50 mg at 04/16/16 2135  . clopidogrel (PLAVIX) tablet 75 mg  75 mg Oral Daily Gonzella Lex, MD   75 mg at 04/16/16 0908  . diltiazem (CARDIZEM CD) 24 hr capsule 180 mg  180 mg Oral Daily Gonzella Lex, MD   180 mg at 04/16/16 0908  . enalapril (VASOTEC) tablet 2.5 mg  2.5 mg Oral Daily Gonzella Lex, MD   2.5 mg at 04/16/16 0908  . insulin aspart (novoLOG) injection 0-15 Units  0-15 Units Subcutaneous TID WC Trust Leh B  Dauntae Derusha, MD   0 Units at 04/16/16 1148  . magnesium hydroxide (MILK OF MAGNESIA) suspension 30 mL  30 mL Oral Daily PRN Gonzella Lex, MD      . perphenazine (TRILAFON) tablet 4 mg  4 mg Oral BID Gonzella Lex, MD   4 mg at 04/16/16 2135  . rosuvastatin (CRESTOR) tablet 5 mg  5 mg Oral q1800 Gonzella Lex, MD   5 mg at 04/16/16 1638  . traZODone (DESYREL) tablet 100 mg  100 mg Oral QHS Clovis Fredrickson, MD   100 mg at 04/16/16 2135    Lab Results:  Results for orders placed or performed during the hospital encounter of 04/15/16 (from the past 48 hour(s))  Hemoglobin A1c     Status: None   Collection Time: 04/16/16  6:40 AM  Result Value Ref Range   Hgb A1c MFr Bld 5.8 4.0 - 6.0 %  Lipid panel,  fasting     Status: None   Collection Time: 04/16/16  6:40 AM  Result Value Ref Range   Cholesterol 146 0 - 200 mg/dL   Triglycerides 100 <150 mg/dL   HDL 58 >40 mg/dL   Total CHOL/HDL Ratio 2.5 RATIO   VLDL 20 0 - 40 mg/dL   LDL Cholesterol 68 0 - 99 mg/dL    Comment:        Total Cholesterol/HDL:CHD Risk Coronary Heart Disease Risk Table                     Men   Women  1/2 Average Risk   3.4   3.3  Average Risk       5.0   4.4  2 X Average Risk   9.6   7.1  3 X Average Risk  23.4   11.0        Use the calculated Patient Ratio above and the CHD Risk Table to determine the patient's CHD Risk.        ATP III CLASSIFICATION (LDL):  <100     mg/dL   Optimal  100-129  mg/dL   Near or Above                    Optimal  130-159  mg/dL   Borderline  160-189  mg/dL   High  >190     mg/dL   Very High   Prolactin     Status: None   Collection Time: 04/16/16  6:40 AM  Result Value Ref Range   Prolactin 8.3 4.8 - 23.3 ng/mL    Comment: (NOTE) Performed At: [facility] [place], [place] [id number] Lindon Romp MD IW:[id number]   TSH     Status: None   Collection Time: 04/16/16  6:40 AM  Result Value Ref Range   TSH 1.825 0.350 - 4.500 uIU/mL  Glucose, capillary     Status: None   Collection Time: 04/16/16 11:46 AM  Result Value Ref Range   Glucose-Capillary 97 65 - 99 mg/dL  Glucose, capillary     Status: None   Collection Time: 04/16/16  4:46 PM  Result Value Ref Range   Glucose-Capillary 98 65 - 99 mg/dL   Comment 1 Notify RN   Urinalysis complete, with microscopic ([facility] only)     Status: Abnormal   Collection Time: 04/16/16  7:00 PM  Result Value Ref Range   Color, Urine STRAW (A) YELLOW   APPearance CLEAR (A) CLEAR  Glucose, UA NEGATIVE NEGATIVE mg/dL   Bilirubin Urine NEGATIVE NEGATIVE   Ketones, ur NEGATIVE NEGATIVE mg/dL   Specific Gravity, Urine 1.002 (L) 1.005 - 1.030   Hgb urine dipstick NEGATIVE NEGATIVE   pH 6.0 5.0 - 8.0    Protein, ur NEGATIVE NEGATIVE mg/dL   Nitrite NEGATIVE NEGATIVE   Leukocytes, UA NEGATIVE NEGATIVE   RBC / HPF NONE SEEN 0 - 5 RBC/hpf   WBC, UA 0-5 0 - 5 WBC/hpf   Bacteria, UA RARE (A) NONE SEEN   Squamous Epithelial / LPF 0-5 (A) NONE SEEN   Mucous PRESENT   Urine Drug Screen, Qualitative ([facility] only)     Status: Abnormal   Collection Time: 04/16/16  7:00 PM  Result Value Ref Range   Tricyclic, Ur Screen NONE DETECTED NONE DETECTED   Amphetamines, Ur Screen NONE DETECTED NONE DETECTED   MDMA (Ecstasy)Ur Screen NONE DETECTED NONE DETECTED   Cocaine Metabolite,Ur [place] NONE DETECTED NONE DETECTED   Opiate, Ur Screen NONE DETECTED NONE DETECTED   Phencyclidine (PCP) Ur S NONE DETECTED NONE DETECTED   Cannabinoid 50 Ng, Ur [place] POSITIVE (A) NONE DETECTED   Barbiturates, Ur Screen NONE DETECTED NONE DETECTED   Benzodiazepine, Ur Scrn NONE DETECTED NONE DETECTED   Methadone Scn, Ur NONE DETECTED NONE DETECTED    Comment: (NOTE) 616  Tricyclics, urine               Cutoff 1000 ng/mL 200  Amphetamines, urine             Cutoff 1000 ng/mL 300  MDMA (Ecstasy), urine           Cutoff 500 ng/mL 400  Cocaine Metabolite, urine       Cutoff 300 ng/mL 500  Opiate, urine                   Cutoff 300 ng/mL 600  Phencyclidine (PCP), urine      Cutoff 25 ng/mL 700  Cannabinoid, urine              Cutoff 50 ng/mL 800  Barbiturates, urine             Cutoff 200 ng/mL 900  Benzodiazepine, urine           Cutoff 200 ng/mL 1000 Methadone, urine                Cutoff 300 ng/mL 1100 1200 The urine drug screen provides only a preliminary, unconfirmed 1300 analytical test result and should not be used for non-medical 1400 purposes. Clinical consideration and professional judgment should 1500 be applied to any positive drug screen result due to possible 1600 interfering substances. A more specific alternate chemical method 1700 must be used in order to obtain a confirmed analytical result.  1800 Gas  chromato graphy / mass spectrometry (GC/MS) is the preferred 1900 confirmatory method.   Glucose, capillary     Status: Abnormal   Collection Time: 04/17/16  6:41 AM  Result Value Ref Range   Glucose-Capillary 141 (H) 65 - 99 mg/dL  Glucose, capillary     Status: Abnormal   Collection Time: 04/17/16 11:59 AM  Result Value Ref Range   Glucose-Capillary 100 (H) 65 - 99 mg/dL    Blood Alcohol level:  Lab Results  Component Value Date   ETH <5 04/15/2016    Physical Findings: AIMS: Facial and Oral Movements Muscles of Facial Expression: None, normal Lips and Perioral Area: None, normal Jaw: None, normal Tongue:  None, normal,Extremity Movements Upper (arms, wrists, hands, fingers): None, normal Lower (legs, knees, ankles, toes): None, normal, Trunk Movements Neck, shoulders, hips: None, normal,  , Dental Status Current problems with teeth and/or dentures?: No Does patient usually wear dentures?: No  CIWA:  CIWA-Ar Total: 7 COWS:     Musculoskeletal: Strength & Muscle Tone: within normal limits Gait & Station: normal Patient leans: N/A  Psychiatric Specialty Exam: Review of Systems  Psychiatric/Behavioral: The patient is nervous/anxious.   All other systems reviewed and are negative.   Blood pressure 99/64, pulse 98, temperature 98.4 F (36.9 C), temperature source Oral, resp. rate 20, height 5' 3.98" (1.625 m), weight 68.04 kg (150 lb), SpO2 99 %.Body mass index is 25.77 kg/(m^2).  General Appearance: Casual  Eye Contact::  Good  Speech:  Pressured  Volume:  Increased  Mood:  Angry, Dysphoric and Irritable  Affect:  Inappropriate and Labile  Thought Process:  Disorganized  Orientation:  Full (Time, Place, and Person)  Thought Content:  Delusions, Hallucinations: Auditory and Paranoid Ideation  Suicidal Thoughts:  No  Homicidal Thoughts:  No  Memory:  Immediate;   Fair Recent;   Fair Remote;   Fair  Judgement:  Poor  Insight:  Lacking  Psychomotor Activity:   Increased  Concentration:  Fair  Recall:  [employer] of Knowledge:Fair  Language: Fair  Akathisia:  No  Handed:  Right  AIMS (if indicated):     Assets:  Communication Skills Desire for Improvement Financial Resources/Insurance Housing Physical Health Resilience  ADL's:  Intact  Cognition: WNL  Sleep:  Number of Hours: 5   Treatment Plan Summary: Daily contact with patient to assess and evaluate symptoms and progress in treatment and Medication management   Ms. Posner is a 65 year old female with a history of bipolar disorder admitted in a manic psychotic episode in the context of treatment noncompliance.  1. Psychosis. She was restarted on perphenazine. We started non emergency forced medications order to give Prolixin injection if the patient refuses Trilafon.  2. Insomnia. Slept only 5 hours with Trazodone. Will start Restoril.  3. Smoking. Nicotine patch is available.  4. Hypertension. She is on Cardizem and enalapril.  5. Coronary artery disease. She is on aspirin, Plavix, Crestor, and Pletal.  6. Diabetes. She is taking to Trulicity at home. We will start sliding scale insulin, blood glucose monitoring, ADA diet.  7. COPD. She is on inhaler.  8. Metabolic syndrome monitoring. Lipid profile, hemoglobin A1c, TSH, and prolactin are pending.  9. Disposition. She will be discharged to home. She needs a follow-up appointment with a new psychiatrist.    Orson Slick, MD 04/17/2016, 2:15 PM

## 2016-04-17 NOTE — Plan of Care (Signed)
Problem: Ineffective individual coping Goal: STG: Patient will remain free from self harm Outcome: Progressing Patient remains free from self harm currently     

## 2016-04-17 NOTE — Tx Team (Signed)
Initial Interdisciplinary Treatment Plan   PATIENT STRESSORS: Educational concerns Health problems Medication change or noncompliance   PATIENT STRENGTHS: Motivation for treatment/growth Religious Affiliation   PROBLEM LIST: Problem List/Patient Goals Date to be addressed Date deferred Reason deferred Estimated date of resolution  Psychosis 04/16/16           Depression  04/16/16           Bipolar  04/16/16                              DISCHARGE CRITERIA:  Improved stabilization in mood, thinking, and/or behavior Medical problems require only outpatient monitoring Motivation to continue treatment in a less acute level of care  PRELIMINARY DISCHARGE PLAN: Attend aftercare/continuing care group Outpatient therapy  PATIENT/FAMIILY INVOLVEMENT: This treatment plan has been presented to and reviewed with Deanna patient, Deanna [name],Deanna patient and family have been given Deanna opportunity to ask questions and make suggestions.  Deanna [name] 04/17/2016, 1:15 AM

## 2016-04-17 NOTE — Tx Team (Signed)
Interdisciplinary Treatment Plan Update (Adult)  Date:  04/17/2016 Time Reviewed:  4:18 PM  Progress in Treatment: Attending groups: Yes. Participating in groups:  Yes. Taking medication as prescribed:  Yes. Tolerating medication:  Yes. Family/Significant othe contact made:  No, will contact:  if patient provides consent Patient understands diagnosis:  No. and As evidenced by:  patient is not able to talk about reason she need hospitalization but is very disorganized.  Discussing patient identified problems/goals with staff:  Yes. Medical problems stabilized or resolved:  Yes. Denies suicidal/homicidal ideation: Yes. Issues/concerns per patient self-inventory:  Yes. Other:  New problem(s) identified: No, Describe:  none reported  Discharge Plan or Barriers: Patient will stabilize and discharge home with outpatietn follow up in [facility]  Reason for Continuation of Hospitalization: Mania Medication stabilization Other; describe parania  Comments:  Estimated length of stay: up to 4 days, expected discharge Monday 04/21/16  New goal(s):  Review of initial/current patient goals per problem list:  1. Goal(s): Participate in aftercare plan   Met: No  Target date: by discharge  As evidenced by: patient will participate in aftercare plan AEB aftercare provider and housing plan identified at discharge 04/17/16: Patient can discharge home once stabilized but needs outpatient follow up in [facility].  2. Goal (s): Decrease mania   Met: No  Target date: by discharge  As evidenced by: patient demonstrates decreased symptoms of mania 04/17/16: Patient is still manic and refusing meds and my have to take forced meds.   3. Goal (s): Decrease psychosis     Met: No  Target date: by discharge  As evidenced by: patient demonstrates decreased symptoms of psychosis 04/17/16: Patient is paranoid and disorganized in her conversations.       Attendees: Patient: Deanna [name] 4/27/20173:43 PM  Physician: Orson Slick, MD 4/27/20173:43 PM  Nursing: Nicanor Bake, RN 4/27/20173:43 PM  Other: Carmell Austria, LCSW 4/27/20173:43 PM  Other:  4/27/20173:43 PM  Other:  4/27/20173:43 PM  Other:  4/27/20173:43 PM  Other:  4/27/20173:43 PM  Other:  4/27/20173:43 PM  Other:  4/27/20173:43 PM  Other:  4/27/20173:43 PM  Other:  4/27/20173:43 PM  Other:  4/27/20173:43 PM             Scribe for Treatment Team:   Keene Breath, MSW, LCSW  04/17/2016, 4:18 PM

## 2016-04-17 NOTE — Plan of Care (Signed)
Problem: Alteration in thought process Goal: LTG-Patient behavior demonstrates decreased signs psychosis (Patient behavior demonstrates decreased signs of psychosis to the point the patient is safe to return home and continue treatment in an outpatient setting.)  Outcome: Not Progressing Patient is responding to internal stimuli.      

## 2016-04-17 NOTE — Progress Notes (Signed)
D:  Patient is alert but questionably oriented on the unit this shift.  Patient did not attend groups today.  Patient denies suicidal ideation, homicidal ideation, auditory or visual hallucinations at the present time. Patient is not willing to discuss her mood with this Clinical research associatewriter currently.  Patient states that she is being held on this unit against her will and does not need nor except any treatment at this time.    A:  Scheduled medications were refused by patient this shift.  Emotional support and encouragement are provided.  Patient is maintained on q.15 minute safety checks.  Patient is informed to notify staff with questions or concerns. R:  Patient is uncooperative with medication administration and treatment plan today.  Patient is irritable, restless and uncooperative on the unit at this time.  Patient interacts somewhat aggressively with others on the unit this shift.  Patient contracts for safety at this time.  Patient remains safe at this time.  Patient is pacing the unit and occasionally slamming doors this shift.  Patient is redirectable.

## 2016-04-18 LAB — LIPID PANEL
Cholesterol: 139 mg/dL (ref 0–200)
HDL: 48 mg/dL (ref >40)
LDL Cholesterol: 65 mg/dL (ref 0–99)
Total CHOL/HDL Ratio: 2.9 RATIO
Triglycerides: 130 mg/dL (ref <150)
VLDL: 26 mg/dL (ref 0–40)

## 2016-04-18 LAB — HEMOGLOBIN A1C: Hgb A1c MFr Bld: 5.9 % (ref 4.0–6.0)

## 2016-04-18 LAB — GLUCOSE, CAPILLARY
Glucose-Capillary: 176 mg/dL — ABNORMAL HIGH (ref 65–99)
Glucose-Capillary: 85 mg/dL (ref 65–99)
Glucose-Capillary: 110 mg/dL — ABNORMAL HIGH (ref 65–99)
Glucose-Capillary: 98 mg/dL (ref 65–99)

## 2016-04-18 LAB — TSH: TSH: 1.86 u[IU]/mL (ref 0.350–4.500)

## 2016-04-18 MED ORDER — PERPHENAZINE 4 MG PO TABS
8.0000 mg | ORAL_TABLET | Freq: Two times a day (BID) | ORAL | Status: DC
  Administered 2016-04-19 (×2): 8 mg via ORAL
  Administered 2016-04-20: 4 mg via ORAL
  Administered 2016-04-20: 8 mg via ORAL
  Administered 2016-04-21 – 2016-04-22 (×4): 4 mg via ORAL
  Administered 2016-04-23: 8 mg via ORAL
  Administered 2016-04-23: 4 mg via ORAL
  Administered 2016-04-24: 8 mg via ORAL
  Filled 2016-04-18 (×12): qty 2

## 2016-04-18 NOTE — Progress Notes (Signed)
Recreation Therapy Notes  Date: 04.28.17 Time: 9:55 am Location: Craft Room  Group Topic: Coping skills  Goal Area(s) Addresses:  Patient will participate in healthy coping skill. Patient will verbalize benefit of using art as a healthy coping skill.  Behavioral Response: Intermittently Attentive, Disruptive  Intervention: Coloring  Activity: Patients were given coloring sheets and instructed to color while they thought about the emotions they were experiencing and what their mind was focused on.  Education: LRT educated patients on healthy coping skills.  Education Outcome: In group clarification offered   Clinical Observations/Feedback: Patient worked on coloring a coloring sheet for a little bit. Patient cried during group and was talking about the FBI, secret service, and her husband dying. Patient did not contribute to group discussion.  Jacquelynn CreeGreene,Rheanna Sergent M, LRT/CTRS 04/18/2016 12:15 PM

## 2016-04-18 NOTE — Plan of Care (Signed)
Problem: Ineffective individual coping Goal: LTG: Patient will report a decrease in negative feelings Outcome: Not Progressing Patient reports that she feels the same way she did on admission, "no better, no worse"

## 2016-04-18 NOTE — Plan of Care (Signed)
Problem: Ineffective individual coping Goal: STG-Increase in ability to manage activities of daily living Outcome: Progressing Patient is able to manage her activities of daily living currently     

## 2016-04-18 NOTE — Plan of Care (Signed)
Problem: Alteration in thought process Goal: LTG-Patient behavior demonstrates decreased signs psychosis (Patient behavior demonstrates decreased signs of psychosis to the point the patient is safe to return home and continue treatment in an outpatient setting.)  Outcome: Not Progressing Patient demonstrate psychosis.      

## 2016-04-18 NOTE — Progress Notes (Signed)
D: Pt denies SI/HI/AVH, but noted responding to internal stimuli. Patient hostile towards staff, and irritable. Pt \\appears   anxious and she is not interacting with peers and staff appropriately.  A: Pt was offered support and encouragement. Pt was given scheduled medications. Pt was encouraged to attend groups. Q 15 minute checks were done for safety.  R:Pt did not attend group.  Pt is taking medication. Pt is not receptive to treatment, safety maintained on unit.

## 2016-04-18 NOTE — Progress Notes (Signed)
[facility]  04/18/2016 4:56 PM Deanna CeaDeborah K [name]  MRN:  [id number]  Subjective:  Deanna [name] is a 65 year old female with a history of bipolar disorder admitted floridly psychotic, agitated and delusional in the context of medication noncompliance. She stopped taking medication after Dr. Imogene Burnhen, Friday psychiatrist has retired. She was restarted on Trilafon, apparently the only medication that works for her and she agrees to take. I increased the dose to 8 mg twice daily today. She is not doing well she is low on the unit talking to herself paranoid and delusional. There are forced medication orders in place.  Deanna [name] is unreasonable, loud, intrusive, disruptive in the milieu and uncooperative.   Principal Problem: Bipolar I disorder, most recent episode manic, severe with psychotic features (HCC) Diagnosis:   Patient Active Problem List   Diagnosis Date Noted  . Bipolar I disorder, most recent episode manic, severe with psychotic features (HCC) [F31.2] 04/16/2016  . Tobacco use disorder [F17.200] 04/16/2016  . Diabetes mellitus without complication (HCC) [E11.9] 04/15/2016  . Kidney disease [N28.9] 04/15/2016  . COPD (chronic obstructive pulmonary disease) (HCC) [J44.9] 04/15/2016  . Hypertension [I10] 04/15/2016  . Coronary artery disease [I25.10] 04/15/2016   Total Time spent with patient: 20 minutes  Past Psychiatric History: Bipolar disorder.  Past Medical History:  Past Medical History  Diagnosis Date  . Diabetes mellitus without complication (HCC)   . Kidney disease   . COPD (chronic obstructive pulmonary disease) (HCC)   . GERD (gastroesophageal reflux disease)    History reviewed. No pertinent past surgical history. Family History: History reviewed. No pertinent family history. Family Psychiatric  History: See H&P. Social History:  History  Alcohol Use No     History  Drug Use No    Social History   Social History  . Marital Status: Divorced     Spouse Name: N/A  . Number of Children: N/A  . Years of Education: N/A   Social History Main Topics  . Smoking status: Current Every Day Smoker -- 0.50 packs/day for 40 years    Types: Cigarettes  . Smokeless tobacco: None  . Alcohol Use: No  . Drug Use: No  . Sexual Activity: Not Currently    Birth Control/ Protection: None   Other Topics Concern  . None   Social History Narrative   Additional Social History:                         Sleep: Fair  Appetite:  Fair  Current Medications: Current Facility-Administered Medications  Medication Dose Route Frequency Provider Last Rate Last Dose  . acetaminophen (TYLENOL) tablet 650 mg  650 mg Oral Q6H PRN Audery AmelJohn T Clapacs, MD      . albuterol (PROVENTIL) (2.5 MG/3ML) 0.083% nebulizer solution 3 mL  3 mL Inhalation Q4H PRN Audery AmelJohn T Clapacs, MD      . alum & mag hydroxide-simeth (MAALOX/MYLANTA) 200-200-20 MG/5ML suspension 30 mL  30 mL Oral Q4H PRN Audery AmelJohn T Clapacs, MD      . aspirin EC tablet 81 mg  81 mg Oral Daily Audery AmelJohn T Clapacs, MD   81 mg at 04/18/16 0921  . cilostazol (PLETAL) tablet 50 mg  50 mg Oral BID Audery AmelJohn T Clapacs, MD   50 mg at 04/18/16 0914  . clopidogrel (PLAVIX) tablet 75 mg  75 mg Oral Daily Audery AmelJohn T Clapacs, MD   75 mg at 04/16/16 0908  . diltiazem (CARDIZEM CD) 24 hr capsule  180 mg  180 mg Oral Daily Audery Amel, MD   180 mg at 04/16/16 0908  . enalapril (VASOTEC) tablet 2.5 mg  2.5 mg Oral Daily Audery Amel, MD   2.5 mg at 04/18/16 0917  . fluPHENAZine (PROLIXIN) injection 5 mg  5 mg Intramuscular BID Shari Prows, MD   5 mg at 04/17/16 1757  . insulin aspart (novoLOG) injection 0-15 Units  0-15 Units Subcutaneous TID WC Hazle Ogburn B Anaiyah Anglemyer, MD   0 Units at 04/16/16 1148  . magnesium hydroxide (MILK OF MAGNESIA) suspension 30 mL  30 mL Oral Daily PRN Audery Amel, MD      . perphenazine (TRILAFON) tablet 4 mg  4 mg Oral BID Audery Amel, MD   4 mg at 04/18/16 0913  . rosuvastatin (CRESTOR) tablet  5 mg  5 mg Oral q1800 Audery Amel, MD   5 mg at 04/17/16 1704  . temazepam (RESTORIL) capsule 15 mg  15 mg Oral QHS Shari Prows, MD   15 mg at 04/17/16 2201    Lab Results:  Results for orders placed or performed during the hospital encounter of 04/15/16 (from the past 48 hour(s))  Urinalysis complete, with microscopic ([facility] only)     Status: Abnormal   Collection Time: 04/16/16  7:00 PM  Result Value Ref Range   Color, Urine STRAW (A) YELLOW   APPearance CLEAR (A) CLEAR   Glucose, UA NEGATIVE NEGATIVE mg/dL   Bilirubin Urine NEGATIVE NEGATIVE   Ketones, ur NEGATIVE NEGATIVE mg/dL   Specific Gravity, Urine 1.002 (L) 1.005 - 1.030   Hgb urine dipstick NEGATIVE NEGATIVE   pH 6.0 5.0 - 8.0   Protein, ur NEGATIVE NEGATIVE mg/dL   Nitrite NEGATIVE NEGATIVE   Leukocytes, UA NEGATIVE NEGATIVE   RBC / HPF NONE SEEN 0 - 5 RBC/hpf   WBC, UA 0-5 0 - 5 WBC/hpf   Bacteria, UA RARE (A) NONE SEEN   Squamous Epithelial / LPF 0-5 (A) NONE SEEN   Mucous PRESENT   Urine Drug Screen, Qualitative ([facility] only)     Status: Abnormal   Collection Time: 04/16/16  7:00 PM  Result Value Ref Range   Tricyclic, Ur Screen NONE DETECTED NONE DETECTED   Amphetamines, Ur Screen NONE DETECTED NONE DETECTED   MDMA (Ecstasy)Ur Screen NONE DETECTED NONE DETECTED   Cocaine Metabolite,Ur [place] NONE DETECTED NONE DETECTED   Opiate, Ur Screen NONE DETECTED NONE DETECTED   Phencyclidine (PCP) Ur S NONE DETECTED NONE DETECTED   Cannabinoid 50 Ng, Ur [place] POSITIVE (A) NONE DETECTED   Barbiturates, Ur Screen NONE DETECTED NONE DETECTED   Benzodiazepine, Ur Scrn NONE DETECTED NONE DETECTED   Methadone Scn, Ur NONE DETECTED NONE DETECTED    Comment: (NOTE) 100  Tricyclics, urine               Cutoff 1000 ng/mL 200  Amphetamines, urine             Cutoff 1000 ng/mL 300  MDMA (Ecstasy), urine           Cutoff 500 ng/mL 400  Cocaine Metabolite, urine       Cutoff 300 ng/mL 500  Opiate, urine                   Cutoff  300 ng/mL 600  Phencyclidine (PCP), urine      Cutoff 25 ng/mL 700  Cannabinoid, urine  Cutoff 50 ng/mL 800  Barbiturates, urine             Cutoff 200 ng/mL 900  Benzodiazepine, urine           Cutoff 200 ng/mL 1000 Methadone, urine                Cutoff 300 ng/mL 1100 1200 The urine drug screen provides only a preliminary, unconfirmed 1300 analytical test result and should not be used for non-medical 1400 purposes. Clinical consideration and professional judgment should 1500 be applied to any positive drug screen result due to possible 1600 interfering substances. A more specific alternate chemical method 1700 must be used in order to obtain a confirmed analytical result.  1800 Gas chromato graphy / mass spectrometry (GC/MS) is the preferred 1900 confirmatory method.   Glucose, capillary     Status: Abnormal   Collection Time: 04/17/16  6:41 AM  Result Value Ref Range   Glucose-Capillary 141 (H) 65 - 99 mg/dL  Glucose, capillary     Status: Abnormal   Collection Time: 04/17/16 11:59 AM  Result Value Ref Range   Glucose-Capillary 100 (H) 65 - 99 mg/dL  Glucose, capillary     Status: Abnormal   Collection Time: 04/17/16  5:03 PM  Result Value Ref Range   Glucose-Capillary 125 (H) 65 - 99 mg/dL  Glucose, capillary     Status: Abnormal   Collection Time: 04/18/16  6:50 AM  Result Value Ref Range   Glucose-Capillary 176 (H) 65 - 99 mg/dL  Glucose, capillary     Status: None   Collection Time: 04/18/16 11:39 AM  Result Value Ref Range   Glucose-Capillary 85 65 - 99 mg/dL   Comment 1 Notify RN   Lipid panel     Status: None   Collection Time: 04/18/16  2:20 PM  Result Value Ref Range   Cholesterol 139 0 - 200 mg/dL   Triglycerides 161 <096 mg/dL   HDL 48 >04 mg/dL   Total CHOL/HDL Ratio 2.9 RATIO   VLDL 26 0 - 40 mg/dL   LDL Cholesterol 65 0 - 99 mg/dL    Comment:        Total Cholesterol/HDL:CHD Risk Coronary Heart Disease Risk Table                     Men    Women  1/2 Average Risk   3.4   3.3  Average Risk       5.0   4.4  2 X Average Risk   9.6   7.1  3 X Average Risk  23.4   11.0        Use the calculated Patient Ratio above and the CHD Risk Table to determine the patient's CHD Risk.        ATP III CLASSIFICATION (LDL):  <100     mg/dL   Optimal  540-981  mg/dL   Near or Above                    Optimal  130-159  mg/dL   Borderline  191-478  mg/dL   High  >295     mg/dL   Very High   Hemoglobin A1c     Status: None   Collection Time: 04/18/16  2:20 PM  Result Value Ref Range   Hgb A1c MFr Bld 5.9 4.0 - 6.0 %  TSH     Status: None   Collection Time: 04/18/16  2:20  PM  Result Value Ref Range   TSH 1.860 0.350 - 4.500 uIU/mL    Blood Alcohol level:  Lab Results  Component Value Date   ETH <5 04/15/2016    Physical Findings: AIMS: Facial and Oral Movements Muscles of Facial Expression: None, normal Lips and Perioral Area: None, normal Jaw: None, normal Tongue: None, normal,Extremity Movements Upper (arms, wrists, hands, fingers): None, normal Lower (legs, knees, ankles, toes): None, normal, Trunk Movements Neck, shoulders, hips: None, normal,  , Dental Status Current problems with teeth and/or dentures?: No Does patient usually wear dentures?: No  CIWA:  CIWA-Ar Total: 7 COWS:     Musculoskeletal: Strength & Muscle Tone: within normal limits Gait & Station: normal Patient leans: N/A  Psychiatric Specialty Exam: Review of Systems  Psychiatric/Behavioral: The patient is nervous/anxious.   All other systems reviewed and are negative.   Blood pressure 134/85, pulse 119, temperature 98.2 F (36.8 C), temperature source Oral, resp. rate 20, height 5' 3.98" (1.625 m), weight 68.04 kg (150 lb), SpO2 99 %.Body mass index is 25.77 kg/(m^2).  General Appearance: Casual  Eye Contact::  Fair  Speech:  Pressured  Volume:  Increased  Mood:  Angry, Dysphoric and Irritable  Affect:  Congruent, Inappropriate and Labile   Thought Process:  Disorganized  Orientation:  Full (Time, Place, and Person)  Thought Content:  Delusions and Paranoid Ideation  Suicidal Thoughts:  No  Homicidal Thoughts:  No  Memory:  Immediate;   Fair Recent;   Fair Remote;   Fair  Judgement:  Poor  Insight:  Lacking  Psychomotor Activity:  Increased  Concentration:  Fair  Recall:  [employer] of Knowledge:Fair  Language: Fair  Akathisia:  No  Handed:  Right  AIMS (if indicated):     Assets:  Communication Skills Desire for Improvement Financial Resources/Insurance Housing Physical Health Resilience  ADL's:  Intact  Cognition: WNL  Sleep:  Number of Hours: 6   Treatment Plan Summary: Daily contact with patient to assess and evaluate symptoms and progress in treatment and Medication management   Deanna [name] is a 65 year old female with a history of bipolar disorder admitted in a manic psychotic episode in the context of treatment noncompliance.  1. Psychosis. She was restarted on perphenazine and increased dose to 8 mg bid. We started non emergency forced medications order to give Prolixin injection if the patient refuses Trilafon.  2. Insomnia. Slept only 5 hours with Trazodone. Will start Restoril.  3. Smoking. Nicotine patch is available.  4. Hypertension. She is on Cardizem and enalapril.  5. Coronary artery disease. She is on aspirin, Plavix, Crestor, and Pletal.  6. Diabetes. She is taking to Trulicity at home. We will start sliding scale insulin, blood glucose monitoring, ADA diet.  7. COPD. She is on inhaler.  8. Metabolic syndrome monitoring. Lipid profile, hemoglobin A1c, TSH are normal. Prolactin is pending.  9. Disposition. She will be discharged to home. She needs a follow-up appointment with a new psychiatrist.  Kristine Linea, MD 04/18/2016, 4:56 PM

## 2016-04-18 NOTE — Progress Notes (Signed)
D:  Patient is alert and somewhat oriented on the unit this shift.  Patient's thought process still appears paranoid and disorganized.  Patient attended and actively participated in groups today.  Patient denies suicidal ideation, homicidal ideation, auditory or visual hallucinations at the present time.  Patient appears slightly less agitated this shift.  A:  Scheduled medications are administered to patient as per MD orders with the exception of a few medications which patient stated were "not right."  Emotional support and encouragement are provided.  Patient is maintained on q.15 minute safety checks.  Patient is informed to notify staff with questions or concerns. R:  No adverse medication reactions are noted.  Patient is cooperative with most of her medications this shift.  Patient is somewhat cooperative with treatment plan today.  Patient is still irritable and verbally hostile at times on the unit at this time.  Patient interacts minimally with others on the unit this shift.  Patient contracts for safety at this time.  Patient remains safe at this time.

## 2016-04-18 NOTE — [facility] Group Notes (Signed)
[facility] LCSW Group Therapy  04/18/2016 3:09 PM  Type of Therapy:  Group Therapy  Participation Level:  Active  Participation Quality:  Intrusive and Monopolizing  Affect:  Labile  Cognitive:  Disorganized and Delusional  Insight:  Limited  Engagement in Therapy:  Limited  Modes of Intervention:  Discussion, Education, Socialization and Support  Summary of Progress/Problems: Emotional Regulation: Patients will identify both negative and positive emotions. They will discuss emotions they have difficulty regulating and how they impact their lives. Patients will be asked to identify healthy coping skills to combat unhealthy reactions to negative emotions.   Pt attended group and stayed the entire time. She struggled to stay on topic. She discussed her family and the death of her parents. "I was my mom's favorite child. I know because I look exactly like her and have all the same diseases." She was disorganized and delusional throughout group.   Sempra EnergyCandace L Stephenia [name] MSW, LCSWA  04/18/2016, 3:09 PM

## 2016-04-19 LAB — GLUCOSE, CAPILLARY
Glucose-Capillary: 112 mg/dL — ABNORMAL HIGH (ref 65–99)
Glucose-Capillary: 116 mg/dL — ABNORMAL HIGH (ref 65–99)
Glucose-Capillary: 100 mg/dL — ABNORMAL HIGH (ref 65–99)

## 2016-04-19 LAB — PROLACTIN: Prolactin: 15.7 ng/mL (ref 4.8–23.3)

## 2016-04-19 NOTE — Progress Notes (Signed)
D: Patient is very labile. She'll be very cooperative at one point and then argumentative the next. She stated many delusions to me during our conversation. She denied SI/HI/AVH. Patient went to group but her thought process was disorganized and tangential.  A: No Medication was given. Encouragement was provided. R: She refused her meds. When talking to her about her Pletal she stated she has enough of that at home. She would not take the other medications because she stated she had never heard of them. When insisting that was what the doctor wanted her to take she stated she did not care. Safety maintained with 15 min checks.

## 2016-04-19 NOTE — Plan of Care (Signed)
Problem: Alteration in thought process Goal: LTG-Patient verbalizes understanding importance med regimen (Patient verbalizes understanding of importance of medication regimen and need to continue outpatient care.)  Outcome: Not Progressing Refusing medications.  Explained the importance of medication but patient states "I will only take my trilafon"

## 2016-04-19 NOTE — Progress Notes (Signed)
[facility] MD Progress Note  04/19/2016 5:47 PM Deanna [name]  MRN:  [id number]  Subjective: Follow-up September 29. Patient is still a little disorganized and confused. All she wants to talk about is her car and her home. When I tried to have a logical conversation about this he would always change the subject. On the unit however she has not been aggressive. She is taking care of her basic activities of daily living. Not currently hyperverbal.  Principal Problem: Bipolar I disorder, most recent episode manic, severe with psychotic features (HCC) Diagnosis:   Patient Active Problem List   Diagnosis Date Noted  . Bipolar I disorder, most recent episode manic, severe with psychotic features (HCC) [F31.2] 04/16/2016  . Tobacco use disorder [F17.200] 04/16/2016  . Diabetes mellitus without complication (HCC) [E11.9] 04/15/2016  . Kidney disease [N28.9] 04/15/2016  . COPD (chronic obstructive pulmonary disease) (HCC) [J44.9] 04/15/2016  . Hypertension [I10] 04/15/2016  . Coronary artery disease [I25.10] 04/15/2016   Total Time spent with patient: 20 minutes  Past Psychiatric History: Bipolar disorder.  Past Medical History:  Past Medical History  Diagnosis Date  . Diabetes mellitus without complication (HCC)   . Kidney disease   . COPD (chronic obstructive pulmonary disease) (HCC)   . GERD (gastroesophageal reflux disease)    History reviewed. No pertinent past surgical history. Family History: History reviewed. No pertinent family history. Family Psychiatric  History: Bipolar disorder. Social History:  History  Alcohol Use No     History  Drug Use No    Social History   Social History  . Marital Status: Divorced    Spouse Name: N/A  . Number of Children: N/A  . Years of Education: N/A   Social History Main Topics  . Smoking status: Current Every Day Smoker -- 0.50 packs/day for 40 years    Types: Cigarettes  . Smokeless tobacco: None  . Alcohol Use: No  . Drug Use: No   . Sexual Activity: Not Currently    Birth Control/ Protection: None   Other Topics Concern  . None   Social History Narrative   Additional Social History:                         Sleep: Fair  Appetite:  Fair  Current Medications: Current Facility-Administered Medications  Medication Dose Route Frequency Provider Last Rate Last Dose  . acetaminophen (TYLENOL) tablet 650 mg  650 mg Oral Q6H PRN Audery Amel, MD      . albuterol (PROVENTIL) (2.5 MG/3ML) 0.083% nebulizer solution 3 mL  3 mL Inhalation Q4H PRN Audery Amel, MD      . alum & mag hydroxide-simeth (MAALOX/MYLANTA) 200-200-20 MG/5ML suspension 30 mL  30 mL Oral Q4H PRN Audery Amel, MD      . aspirin EC tablet 81 mg  81 mg Oral Daily Audery Amel, MD   81 mg at 04/18/16 0921  . cilostazol (PLETAL) tablet 50 mg  50 mg Oral BID Audery Amel, MD   50 mg at 04/18/16 0914  . clopidogrel (PLAVIX) tablet 75 mg  75 mg Oral Daily Audery Amel, MD   75 mg at 04/16/16 0908  . diltiazem (CARDIZEM CD) 24 hr capsule 180 mg  180 mg Oral Daily Audery Amel, MD   180 mg at 04/16/16 0908  . enalapril (VASOTEC) tablet 2.5 mg  2.5 mg Oral Daily Audery Amel, MD   2.5 mg at 04/18/16  9562  . fluPHENAZine (PROLIXIN) injection 5 mg  5 mg Intramuscular BID Shari Prows, MD   5 mg at 04/17/16 1757  . insulin aspart (novoLOG) injection 0-15 Units  0-15 Units Subcutaneous TID WC Jolanta B Pucilowska, MD   0 Units at 04/16/16 1148  . magnesium hydroxide (MILK OF MAGNESIA) suspension 30 mL  30 mL Oral Daily PRN Audery Amel, MD      . perphenazine (TRILAFON) tablet 8 mg  8 mg Oral BID Shari Prows, MD   8 mg at 04/19/16 0905  . rosuvastatin (CRESTOR) tablet 5 mg  5 mg Oral q1800 Audery Amel, MD   5 mg at 04/18/16 1705  . temazepam (RESTORIL) capsule 15 mg  15 mg Oral QHS Shari Prows, MD   15 mg at 04/17/16 2201    Lab Results:  Results for orders placed or performed during the hospital encounter  of 04/15/16 (from the past 48 hour(s))  Glucose, capillary     Status: Abnormal   Collection Time: 04/18/16  6:50 AM  Result Value Ref Range   Glucose-Capillary 176 (H) 65 - 99 mg/dL  Glucose, capillary     Status: None   Collection Time: 04/18/16 11:39 AM  Result Value Ref Range   Glucose-Capillary 85 65 - 99 mg/dL   Comment 1 Notify RN   Lipid panel     Status: None   Collection Time: 04/18/16  2:20 PM  Result Value Ref Range   Cholesterol 139 0 - 200 mg/dL   Triglycerides 130 <865 mg/dL   HDL 48 >78 mg/dL   Total CHOL/HDL Ratio 2.9 RATIO   VLDL 26 0 - 40 mg/dL   LDL Cholesterol 65 0 - 99 mg/dL    Comment:        Total Cholesterol/HDL:CHD Risk Coronary Heart Disease Risk Table                     Men   Women  1/2 Average Risk   3.4   3.3  Average Risk       5.0   4.4  2 X Average Risk   9.6   7.1  3 X Average Risk  23.4   11.0        Use the calculated Patient Ratio above and the CHD Risk Table to determine the patient's CHD Risk.        ATP III CLASSIFICATION (LDL):  <100     mg/dL   Optimal  469-629  mg/dL   Near or Above                    Optimal  130-159  mg/dL   Borderline  528-413  mg/dL   High  >244     mg/dL   Very High   Hemoglobin A1c     Status: None   Collection Time: 04/18/16  2:20 PM  Result Value Ref Range   Hgb A1c MFr Bld 5.9 4.0 - 6.0 %  TSH     Status: None   Collection Time: 04/18/16  2:20 PM  Result Value Ref Range   TSH 1.860 0.350 - 4.500 uIU/mL  Prolactin     Status: None   Collection Time: 04/18/16  2:20 PM  Result Value Ref Range   Prolactin 15.7 4.8 - 23.3 ng/mL    Comment: (NOTE) Performed At: [facility] [address] [place], [place] [id number] Mila Homer MD [id number]  Glucose, capillary     Status: Abnormal   Collection Time: 04/18/16  6:39 PM  Result Value Ref Range   Glucose-Capillary 110 (H) 65 - 99 mg/dL  Glucose, capillary     Status: None   Collection Time: 04/18/16  8:23 PM  Result Value Ref  Range   Glucose-Capillary 98 65 - 99 mg/dL  Glucose, capillary     Status: Abnormal   Collection Time: 04/19/16  6:38 AM  Result Value Ref Range   Glucose-Capillary 112 (H) 65 - 99 mg/dL  Glucose, capillary     Status: Abnormal   Collection Time: 04/19/16 11:47 AM  Result Value Ref Range   Glucose-Capillary 116 (H) 65 - 99 mg/dL   Comment 1 Notify RN   Glucose, capillary     Status: Abnormal   Collection Time: 04/19/16  4:23 PM  Result Value Ref Range   Glucose-Capillary 100 (H) 65 - 99 mg/dL    Blood Alcohol level:  Lab Results  Component Value Date   ETH <5 04/15/2016    Physical Findings: AIMS: Facial and Oral Movements Muscles of Facial Expression: None, normal Lips and Perioral Area: None, normal Jaw: None, normal Tongue: None, normal,Extremity Movements Upper (arms, wrists, hands, fingers): None, normal Lower (legs, knees, ankles, toes): None, normal, Trunk Movements Neck, shoulders, hips: None, normal,  , Dental Status Current problems with teeth and/or dentures?: No Does patient usually wear dentures?: No  CIWA:  CIWA-Ar Total: 7 COWS:     Musculoskeletal: Strength & Muscle Tone: within normal limits Gait & Station: normal Patient leans: N/A  Psychiatric Specialty Exam: Review of Systems  Psychiatric/Behavioral: The patient is nervous/anxious.   All other systems reviewed and are negative.   Blood pressure 106/62, pulse 99, temperature 98.2 F (36.8 C), temperature source Oral, resp. rate 20, height 5' 3.98" (1.625 m), weight 68.04 kg (150 lb), SpO2 99 %.Body mass index is 25.77 kg/(m^2).  General Appearance: Casual  Eye Contact::  Good  Speech:  Pressured  Volume:  Increased  Mood:  Angry, Dysphoric and Irritable  Affect:  Inappropriate and Labile  Thought Process:  Disorganized  Orientation:  Full (Time, Place, and Person)  Thought Content:  Delusions, Hallucinations: Auditory and Paranoid Ideation  Suicidal Thoughts:  No  Homicidal Thoughts:  No   Memory:  Immediate;   Fair Recent;   Fair Remote;   Fair  Judgement:  Poor  Insight:  Lacking  Psychomotor Activity:  Increased  Concentration:  Fair  Recall:  [employer] of Knowledge:Fair  Language: Fair  Akathisia:  No  Handed:  Right  AIMS (if indicated):     Assets:  Communication Skills Desire for Improvement Financial Resources/Insurance Housing Physical Health Resilience  ADL's:  Intact  Cognition: WNL  Sleep:  Number of Hours: 7.75   Treatment Plan Summary: Daily contact with patient to assess and evaluate symptoms and progress in treatment and Medication management    Blood pressure is doing well and so she has declined some of the blood pressure medicine which is probably appropriate. It appears that she may be also refusing some of her other medication and we talked about the appropriateness of that. Sugars are improving. Patient is still taking her perphenazine. Appears to be less psychotic and improving but still confused and not able to prioritize her thinking clearly through her situation. Not yet ready for discharge. Tried to encourage her to be more compliant with medicine.  Deanna [name] is a 65 year old female with a history of  bipolar disorder admitted in a manic psychotic episode in the context of treatment noncompliance.  1. Psychosis. She was restarted on perphenazine. We started non emergency forced medications order to give Prolixin injection if the patient refuses Trilafon.  2. Insomnia. Slept only 5 hours with Trazodone. Will start Restoril.  3. Smoking. Nicotine patch is available.  4. Hypertension. She is on Cardizem and enalapril.  5. Coronary artery disease. She is on aspirin, Plavix, Crestor, and Pletal.  6. Diabetes. She is taking to Trulicity at home. We will start sliding scale insulin, blood glucose monitoring, ADA diet.  7. COPD. She is on inhaler.  8. Metabolic syndrome monitoring. Lipid profile, hemoglobin A1c, TSH, and prolactin  are pending.  9. Disposition. She will be discharged to home. She needs a follow-up appointment with a new psychiatrist.    Mordecai RasmussenJohn Aprille Sawhney, MD 04/19/2016, 5:47 PM

## 2016-04-19 NOTE — [facility] Group Notes (Signed)
[facility] LCSW Group Therapy  04/19/2016 3:17 PM  Type of Therapy:  Group Therapy  Participation Level:  Active  Participation Quality:  Attentive  Affect:  Labile  Cognitive:  Disorganized  Insight:  Limited  Engagement in Therapy:  Limited  Modes of Intervention:  Discussion, Education, Socialization and Support  Summary of Progress/Problems: Feelings around Relapse. Group members discussed the meaning of relapse and shared personal stories of relapse, how it affected them and others, and how they perceived themselves during this time. Group members were encouraged to identify triggers, warning signs and coping skills used when facing the possibility of relapse. Social supports were discussed and explored in detail. Pt attended group and stayed the entire time. She struggled to stay on topic and relate to other group members. When other group members would make suggestions, she would become irritable.   Sempra EnergyCandace L Lannie Yusuf MSW, LCSWA  04/19/2016, 3:17 PM

## 2016-04-19 NOTE — Progress Notes (Signed)
Patient is irritable.  Refuses to take all medications except for her tiilafon.  Verbalizes that she does not know why she is here.  No interaction with peers or staff. Safety maintained.  Support an encouragement offered.

## 2016-04-20 LAB — GLUCOSE, CAPILLARY
Glucose-Capillary: 155 mg/dL — ABNORMAL HIGH (ref 65–99)
Glucose-Capillary: 94 mg/dL (ref 65–99)
Glucose-Capillary: 108 mg/dL — ABNORMAL HIGH (ref 65–99)

## 2016-04-20 MED ORDER — DILTIAZEM HCL ER COATED BEADS 180 MG PO CP24
180.0000 mg | ORAL_CAPSULE | Freq: Every day | ORAL | Status: DC
  Administered 2016-04-20 – 2016-04-24 (×5): 180 mg via ORAL
  Filled 2016-04-20 (×6): qty 1

## 2016-04-20 MED ORDER — ROSUVASTATIN CALCIUM 5 MG PO TABS
5.0000 mg | ORAL_TABLET | Freq: Every day | ORAL | Status: DC
  Administered 2016-04-20 – 2016-04-24 (×4): 5 mg via ORAL
  Filled 2016-04-20 (×5): qty 1

## 2016-04-20 NOTE — [facility] Group Notes (Signed)
[facility] Group Notes:  (Nursing/MHT/Case Management/Adjunct)  Date:  04/20/2016  Time:  6:18 AM  Type of Therapy:  Group Therapy  Participation Level:  Active  Participation Quality:  Appropriate  Affect:  Appropriate  Cognitive:  Appropriate  Insight:  Appropriate  Engagement in Group:  Engaged  Modes of Intervention:  n/a  Summary of Progress/Problems:  [name] Imani Atilano Covelli 04/20/2016, 6:18 AM

## 2016-04-20 NOTE — Progress Notes (Signed)
[facility] MD Progress Note  04/20/2016 5:28 PM Deanna [name]  MRN:  [id number]  Subjective: Follow-up April 30. Patient looks a little bit better today. She says she is still worried about her house. She is not as disorganized in her thinking however. She was able to discuss some of her other medications and medical problems a little better. I did point out to her that her blood pressure was elevated today. Patient is not complaining of any hallucinations. Insight is still only passable.  Principal Problem: Bipolar I disorder, most recent episode manic, severe with psychotic features (HCC) Diagnosis:   Patient Active Problem List   Diagnosis Date Noted  . Bipolar I disorder, most recent episode manic, severe with psychotic features (HCC) [F31.2] 04/16/2016  . Tobacco use disorder [F17.200] 04/16/2016  . Diabetes mellitus without complication (HCC) [E11.9] 04/15/2016  . Kidney disease [N28.9] 04/15/2016  . COPD (chronic obstructive pulmonary disease) (HCC) [J44.9] 04/15/2016  . Hypertension [I10] 04/15/2016  . Coronary artery disease [I25.10] 04/15/2016   Total Time spent with patient: 20 minutes  Past Psychiatric History: Bipolar disorder.  Past Medical History:  Past Medical History  Diagnosis Date  . Diabetes mellitus without complication (HCC)   . Kidney disease   . COPD (chronic obstructive pulmonary disease) (HCC)   . GERD (gastroesophageal reflux disease)    History reviewed. No pertinent past surgical history. Family History: History reviewed. No pertinent family history. Family Psychiatric  History: Bipolar disorder. Social History:  History  Alcohol Use No     History  Drug Use No    Social History   Social History  . Marital Status: Divorced    Spouse Name: N/A  . Number of Children: N/A  . Years of Education: N/A   Social History Main Topics  . Smoking status: Current Every Day Smoker -- 0.50 packs/day for 40 years    Types: Cigarettes  . Smokeless tobacco:  None  . Alcohol Use: No  . Drug Use: No  . Sexual Activity: Not Currently    Birth Control/ Protection: None   Other Topics Concern  . None   Social History Narrative   Additional Social History:                         Sleep: Fair  Appetite:  Fair  Current Medications: Current Facility-Administered Medications  Medication Dose Route Frequency Provider Last Rate Last Dose  . acetaminophen (TYLENOL) tablet 650 mg  650 mg Oral Q6H PRN Audery Amel, MD      . albuterol (PROVENTIL) (2.5 MG/3ML) 0.083% nebulizer solution 3 mL  3 mL Inhalation Q4H PRN Audery Amel, MD      . alum & mag hydroxide-simeth (MAALOX/MYLANTA) 200-200-20 MG/5ML suspension 30 mL  30 mL Oral Q4H PRN Audery Amel, MD      . aspirin EC tablet 81 mg  81 mg Oral Daily Audery Amel, MD   81 mg at 04/20/16 4098  . cilostazol (PLETAL) tablet 50 mg  50 mg Oral BID Audery Amel, MD   50 mg at 04/19/16 2235  . clopidogrel (PLAVIX) tablet 75 mg  75 mg Oral Daily Audery Amel, MD   75 mg at 04/16/16 0908  . diltiazem (CARDIZEM CD) 24 hr capsule 180 mg  180 mg Oral QHS Audery Amel, MD      . enalapril (VASOTEC) tablet 2.5 mg  2.5 mg Oral Daily Audery Amel, MD  2.5 mg at 04/20/16 0852  . insulin aspart (novoLOG) injection 0-15 Units  0-15 Units Subcutaneous TID WC Jolanta B Pucilowska, MD   0 Units at 04/16/16 1148  . magnesium hydroxide (MILK OF MAGNESIA) suspension 30 mL  30 mL Oral Daily PRN Audery AmelJohn T Donyell Ding, MD      . perphenazine (TRILAFON) tablet 8 mg  8 mg Oral BID Shari ProwsJolanta B Pucilowska, MD   4 mg at 04/20/16 0851  . rosuvastatin (CRESTOR) tablet 5 mg  5 mg Oral QHS Audery AmelJohn T Pau Banh, MD      . temazepam (RESTORIL) capsule 15 mg  15 mg Oral QHS Shari ProwsJolanta B Pucilowska, MD   15 mg at 04/19/16 2234    Lab Results:  Results for orders placed or performed during the hospital encounter of 04/15/16 (from the past 48 hour(s))  Glucose, capillary     Status: Abnormal   Collection Time: 04/18/16  6:39 PM   Result Value Ref Range   Glucose-Capillary 110 (H) 65 - 99 mg/dL  Glucose, capillary     Status: None   Collection Time: 04/18/16  8:23 PM  Result Value Ref Range   Glucose-Capillary 98 65 - 99 mg/dL  Glucose, capillary     Status: Abnormal   Collection Time: 04/19/16  6:38 AM  Result Value Ref Range   Glucose-Capillary 112 (H) 65 - 99 mg/dL  Glucose, capillary     Status: Abnormal   Collection Time: 04/19/16 11:47 AM  Result Value Ref Range   Glucose-Capillary 116 (H) 65 - 99 mg/dL   Comment 1 Notify RN   Glucose, capillary     Status: Abnormal   Collection Time: 04/19/16  4:23 PM  Result Value Ref Range   Glucose-Capillary 100 (H) 65 - 99 mg/dL  Glucose, capillary     Status: Abnormal   Collection Time: 04/20/16  7:02 AM  Result Value Ref Range   Glucose-Capillary 155 (H) 65 - 99 mg/dL  Glucose, capillary     Status: None   Collection Time: 04/20/16 11:53 AM  Result Value Ref Range   Glucose-Capillary 94 65 - 99 mg/dL   Comment 1 Notify RN   Glucose, capillary     Status: Abnormal   Collection Time: 04/20/16  4:09 PM  Result Value Ref Range   Glucose-Capillary 108 (H) 65 - 99 mg/dL    Blood Alcohol level:  Lab Results  Component Value Date   ETH <5 04/15/2016    Physical Findings: AIMS: Facial and Oral Movements Muscles of Facial Expression: None, normal Lips and Perioral Area: None, normal Jaw: None, normal Tongue: None, normal,Extremity Movements Upper (arms, wrists, hands, fingers): None, normal Lower (legs, knees, ankles, toes): None, normal, Trunk Movements Neck, shoulders, hips: None, normal,  , Dental Status Current problems with teeth and/or dentures?: No Does patient usually wear dentures?: No  CIWA:  CIWA-Ar Total: 7 COWS:     Musculoskeletal: Strength & Muscle Tone: within normal limits Gait & Station: normal Patient leans: N/A  Psychiatric Specialty Exam: Review of Systems  Psychiatric/Behavioral: The patient is nervous/anxious.   All  other systems reviewed and are negative.   Blood pressure 151/90, pulse 116, temperature 98.6 F (37 C), temperature source Oral, resp. rate 20, height 5' 3.98" (1.625 m), weight 68.04 kg (150 lb), SpO2 99 %.Body mass index is 25.77 kg/(m^2).  General Appearance: Casual  Eye Contact::  Good  Speech:  Pressured  Volume:  Increased  Mood:  Angry, Dysphoric and Irritable  Affect:  Inappropriate  and Labile  Thought Process:  Disorganized  Orientation:  Full (Time, Place, and Person)  Thought Content:  Delusions, Hallucinations: Auditory and Paranoid Ideation  Suicidal Thoughts:  No  Homicidal Thoughts:  No  Memory:  Immediate;   Fair Recent;   Fair Remote;   Fair  Judgement:  Poor  Insight:  Lacking  Psychomotor Activity:  Increased  Concentration:  Fair  Recall:  [employer] of Knowledge:Fair  Language: Fair  Akathisia:  No  Handed:  Right  AIMS (if indicated):     Assets:  Communication Skills Desire for Improvement Financial Resources/Insurance Housing Physical Health Resilience  ADL's:  Intact  Cognition: WNL  Sleep:  Number of Hours: 7.75   Treatment Plan Summary: Daily contact with patient to assess and evaluate symptoms and progress in treatment and Medication management    I made some changes to her medicine because she inform me that there were certain medicines that she would only take at night. Her cardio some in her blood thinner she says she will only take at nighttime but if we change it to that she is more likely to be compliant. She appears to be taking her psychiatric medicine. She is not acutely dangerous but still a little disorganized in her thinking. Gradually getting better. No other change to treatment plan other than changing the times of some overdoses.  Ms. Forgy is a 65 year old female with a history of bipolar disorder admitted in a manic psychotic episode in the context of treatment noncompliance.  1. Psychosis. She was restarted on  perphenazine. We started non emergency forced medications order to give Prolixin injection if the patient refuses Trilafon.  2. Insomnia. Slept only 5 hours with Trazodone. Will start Restoril.  3. Smoking. Nicotine patch is available.  4. Hypertension. She is on Cardizem and enalapril.  5. Coronary artery disease. She is on aspirin, Plavix, Crestor, and Pletal.  6. Diabetes. She is taking to Trulicity at home. We will start sliding scale insulin, blood glucose monitoring, ADA diet.  7. COPD. She is on inhaler.  8. Metabolic syndrome monitoring. Lipid profile, hemoglobin A1c, TSH, and prolactin are pending.  9. Disposition. She will be discharged to home. She needs a follow-up appointment with a new psychiatrist.    Mordecai Rasmussen, MD 04/20/2016, 5:28 PM

## 2016-04-20 NOTE — Progress Notes (Signed)
Denies SI/HI/AVH. Rates depression as a 4 and anxiety as a 4.  Affect blunted.  Visible in the milieu. At times get irritated with other patients and will return to her room. Support and encouragement offered. Safety maintained.

## 2016-04-20 NOTE — Progress Notes (Signed)
D: Pt denies SI/HI/AVH, but noted responding to internal stimuli. Pt is pleasant and cooperative, affect is flat and sad, denies pain. Pt appears less anxious and she is not interacting with peers and staff appropriately. .  A: Pt was offered support and encouragement. Pt was given scheduled medications. Pt was encouraged to attend groups. Q 15 minute checks were done for safety.  R:Pt attends group and does not interact appropriately  with peers and staff. Pt is taking medication. Pt receptive to treatment and safety maintained on unit.

## 2016-04-20 NOTE — Plan of Care (Signed)
Problem: Alteration in thought process Goal: LTG-Patient behavior demonstrates decreased signs psychosis (Patient behavior demonstrates decreased signs of psychosis to the point the patient is safe to return home and continue treatment in an outpatient setting.) Outcome: Not Progressing Patient responding to internal stimuli.      

## 2016-04-20 NOTE — [facility] Group Notes (Signed)
[facility] LCSW Group Therapy  04/20/2016 2:52 PM  Type of Therapy:  Group Therapy  Participation Level:  Active  Participation Quality:  Intrusive  Affect:  Labile  Cognitive:  Alert  Insight:  Limited  Engagement in Therapy:  Limited  Modes of Intervention:  Discussion, Education, Socialization and Support  Summary of Progress/Problems: Boundaries: Patients defined boundaries and discussed the importance of them. Patients identified their own boundaries and how they feel when they are crossed. Patients discussed ways to create and/ or improve their personal boundaries.  Pt attended group and stayed the entire time. She struggled to stay on topic. She was disorganized and irritable throughout group.   Benancio Osmundson L Shaneeka Scarboro MSW, LCSWA  04/20/2016, 2:52 PM

## 2016-04-21 LAB — GLUCOSE, CAPILLARY
Glucose-Capillary: 111 mg/dL — ABNORMAL HIGH (ref 65–99)
Glucose-Capillary: 118 mg/dL — ABNORMAL HIGH (ref 65–99)
Glucose-Capillary: 92 mg/dL (ref 65–99)

## 2016-04-21 NOTE — Plan of Care (Signed)
Problem: Alteration in mood Goal: LTG-Patient reports reduction in suicidal thoughts (Patient reports reduction in suicidal thoughts and is able to verbalize a safety plan for whenever patient is feeling suicidal)  Outcome: Progressing Patient denies SI.      

## 2016-04-21 NOTE — [facility] Group Notes (Signed)
[facility] Group Notes:  (Nursing/MHT/Case Management/Adjunct)  Date:  04/21/2016  Time:  3:51 PM  Type of Therapy:  Psychoeducational Skills  Participation Level:  Active  Participation Quality:  Appropriate, Attentive and Sharing  Affect:  Appropriate  Cognitive:  Alert and Appropriate  Insight:  Appropriate  Engagement in Group:  Engaged  Modes of Intervention:  Discussion, Education and Support  Summary of Progress/Problems:  Deanna SmokeCara Travis [name] 04/21/2016, 3:51 PM

## 2016-04-21 NOTE — Progress Notes (Signed)
[facility] MD Progress Note  04/21/2016 1:12 PM Deanna [name]  MRN:  [id number]  Subjective:  Deanna [name] remains paranoid, delusional, disorganized, argumentative, intrusive, loud, refusing medications. She takes only 4 mg of Trilafon. She insisted be discharged to home on 72 hour pass to take care of her bills but is unable to figure out what to do in order to get out of the hospital. She has no somatic complaints. She is disruptive in groups.  Principal Problem: Bipolar I disorder, most recent episode manic, severe with psychotic features (HCC) Diagnosis:   Patient Active Problem List   Diagnosis Date Noted  . Bipolar I disorder, most recent episode manic, severe with psychotic features (HCC) [F31.2] 04/16/2016  . Tobacco use disorder [F17.200] 04/16/2016  . Diabetes mellitus without complication (HCC) [E11.9] 04/15/2016  . Kidney disease [N28.9] 04/15/2016  . COPD (chronic obstructive pulmonary disease) (HCC) [J44.9] 04/15/2016  . Hypertension [I10] 04/15/2016  . Coronary artery disease [I25.10] 04/15/2016   Total Time spent with patient: 20 minutes  Past Psychiatric History: Bipolar disorder.  Past Medical History:  Past Medical History  Diagnosis Date  . Diabetes mellitus without complication (HCC)   . Kidney disease   . COPD (chronic obstructive pulmonary disease) (HCC)   . GERD (gastroesophageal reflux disease)    History reviewed. No pertinent past surgical history. Family History: History reviewed. No pertinent family history. Family Psychiatric  History: See H&P. Social History:  History  Alcohol Use No     History  Drug Use No    Social History   Social History  . Marital Status: Divorced    Spouse Name: N/A  . Number of Children: N/A  . Years of Education: N/A   Social History Main Topics  . Smoking status: Current Every Day Smoker -- 0.50 packs/day for 40 years    Types: Cigarettes  . Smokeless tobacco: None  . Alcohol Use: No  . Drug Use: No  . Sexual  Activity: Not Currently    Birth Control/ Protection: None   Other Topics Concern  . None   Social History Narrative   Additional Social History:                         Sleep: Fair  Appetite:  Fair  Current Medications: Current Facility-Administered Medications  Medication Dose Route Frequency Provider Last Rate Last Dose  . acetaminophen (TYLENOL) tablet 650 mg  650 mg Oral Q6H PRN Audery Amel, MD      . albuterol (PROVENTIL) (2.5 MG/3ML) 0.083% nebulizer solution 3 mL  3 mL Inhalation Q4H PRN Audery Amel, MD      . alum & mag hydroxide-simeth (MAALOX/MYLANTA) 200-200-20 MG/5ML suspension 30 mL  30 mL Oral Q4H PRN Audery Amel, MD      . aspirin EC tablet 81 mg  81 mg Oral Daily Audery Amel, MD   81 mg at 04/20/16 2952  . cilostazol (PLETAL) tablet 50 mg  50 mg Oral BID Audery Amel, MD   50 mg at 04/20/16 2202  . clopidogrel (PLAVIX) tablet 75 mg  75 mg Oral Daily Audery Amel, MD   75 mg at 04/16/16 0908  . diltiazem (CARDIZEM CD) 24 hr capsule 180 mg  180 mg Oral QHS Audery Amel, MD   180 mg at 04/20/16 2203  . enalapril (VASOTEC) tablet 2.5 mg  2.5 mg Oral Daily Audery Amel, MD   2.5 mg at 04/20/16  1610  . insulin aspart (novoLOG) injection 0-15 Units  0-15 Units Subcutaneous TID WC Meiling Hendriks B Raydin Bielinski, MD   0 Units at 04/16/16 1148  . magnesium hydroxide (MILK OF MAGNESIA) suspension 30 mL  30 mL Oral Daily PRN Audery Amel, MD      . perphenazine (TRILAFON) tablet 8 mg  8 mg Oral BID Shari Prows, MD   4 mg at 04/21/16 1054  . rosuvastatin (CRESTOR) tablet 5 mg  5 mg Oral QHS Audery Amel, MD   5 mg at 04/20/16 2201  . temazepam (RESTORIL) capsule 15 mg  15 mg Oral QHS Shari Prows, MD   15 mg at 04/20/16 2202    Lab Results:  Results for orders placed or performed during the hospital encounter of 04/15/16 (from the past 48 hour(s))  Glucose, capillary     Status: Abnormal   Collection Time: 04/19/16  4:23 PM  Result  Value Ref Range   Glucose-Capillary 100 (H) 65 - 99 mg/dL  Glucose, capillary     Status: Abnormal   Collection Time: 04/20/16  7:02 AM  Result Value Ref Range   Glucose-Capillary 155 (H) 65 - 99 mg/dL  Glucose, capillary     Status: None   Collection Time: 04/20/16 11:53 AM  Result Value Ref Range   Glucose-Capillary 94 65 - 99 mg/dL   Comment 1 Notify RN   Glucose, capillary     Status: Abnormal   Collection Time: 04/20/16  4:09 PM  Result Value Ref Range   Glucose-Capillary 108 (H) 65 - 99 mg/dL  Glucose, capillary     Status: Abnormal   Collection Time: 04/21/16  6:48 AM  Result Value Ref Range   Glucose-Capillary 111 (H) 65 - 99 mg/dL  Glucose, capillary     Status: Abnormal   Collection Time: 04/21/16 11:51 AM  Result Value Ref Range   Glucose-Capillary 118 (H) 65 - 99 mg/dL    Blood Alcohol level:  Lab Results  Component Value Date   ETH <5 04/15/2016    Physical Findings: AIMS: Facial and Oral Movements Muscles of Facial Expression: None, normal Lips and Perioral Area: None, normal Jaw: None, normal Tongue: None, normal,Extremity Movements Upper (arms, wrists, hands, fingers): None, normal Lower (legs, knees, ankles, toes): None, normal, Trunk Movements Neck, shoulders, hips: None, normal,  , Dental Status Current problems with teeth and/or dentures?: No Does patient usually wear dentures?: No  CIWA:  CIWA-Ar Total: 7 COWS:     Musculoskeletal: Strength & Muscle Tone: within normal limits Gait & Station: normal Patient leans: N/A  Psychiatric Specialty Exam: Review of Systems  Psychiatric/Behavioral: Positive for hallucinations.  All other systems reviewed and are negative.   Blood pressure 117/97, pulse 102, temperature 98.8 F (37.1 C), temperature source Oral, resp. rate 20, height 5' 3.98" (1.625 m), weight 68.04 kg (150 lb), SpO2 99 %.Body mass index is 25.77 kg/(m^2).  General Appearance: Casual  Eye Contact::  Good  Speech:  Pressured   Volume:  Increased  Mood:  Angry, Dysphoric and Irritable  Affect:  Inappropriate and Labile  Thought Process:  Disorganized  Orientation:  Full (Time, Place, and Person)  Thought Content:  Delusions and Paranoid Ideation  Suicidal Thoughts:  No  Homicidal Thoughts:  No  Memory:  Immediate;   Fair Recent;   Fair Remote;   Fair  Judgement:  Poor  Insight:  Lacking  Psychomotor Activity:  Increased  Concentration:  Fair  Recall:  [employer]  of Knowledge:Fair  Language: Fair  Akathisia:  No  Handed:  Right  AIMS (if indicated):     Assets:  Communication Skills Desire for Improvement Financial Resources/Insurance Housing Physical Health Resilience Social Support  ADL's:  Intact  Cognition: WNL  Sleep:  Number of Hours: 7.75   Treatment Plan Summary: Daily contact with patient to assess and evaluate symptoms and progress in treatment and Medication management   Ms. Deanna [name] is a 65 year old female with a history of bipolar disorder admitted in a manic psychotic episode in the context of treatment noncompliance.  1. Psychosis. She was restarted on perphenazine and tried to increase dose to 8 mg twice daily but the patient takes 4 mf only. She refuses a mood stabilizer.   2. Insomnia. Slept did not respond well to Trazodone. We started Restoril.  3. Smoking. Nicotine patch is available.  4. Hypertension. She is on Cardizem and enalapril.  5. Coronary artery disease. She is on aspirin, Plavix, Crestor, and Pletal.  6. Diabetes. She is taking to Trulicity at home. We will start sliding scale insulin, blood glucose monitoring, ADA diet.  7. COPD. She is on inhaler.  8. Metabolic syndrome monitoring. Lipid profile, hemoglobin A1c, TSH are normal. Prolactin 15.7.  9. She will be discharged to home. She needs follow-up appointment with a new psychiatrist.   Kristine LineaJolanta Patsy Zaragoza, MD 04/21/2016, 1:12 PM

## 2016-04-21 NOTE — Progress Notes (Signed)
Recreation Therapy Notes  Date: 05.01.17 Time: 9:30 am Location: Craft Room  Group Topic: Self-expression  Goal Area(s) Addresses:  Patient will be able to identify a color that represents each emotion. Patient will verbalize benefit of using art as a means of self-expression. Patient will verbalize one positive emotion experienced while participating in activity.  Behavioral Response: Attentive, Interactive, Off topic  Intervention: The Colors Within Me  Activity: Patients were given a blank face worksheet and instructed to pick a color for each emotion they were feeling and show how much of that emotion they were feeling on the face worksheet.  Education: LRT educated patients on other forms of self-expression.  Education Outcome: In group clarification offered  Clinical Observations/Feedback: Patientworked activity drawing uncle's self-portrait. Patient contributed to group discussion by stating seeing her emotions in color and on paper was a starting point. Patient then talked about uncle having biracial children and how he is happy when he sees them. Patient talked about how she felt like her mother squashed her creativity because her mother potty trained her at 89 which according to the patient was too early.   Jacquelynn CreeGreene,Kate Larock M, LRT/CTRS 04/21/2016 10:35 AM

## 2016-04-21 NOTE — Progress Notes (Signed)
D: Patient is alert and oriented on the unit this shift. Patient attended and actively participated in groups today. Patient denies suicidal ideation, homicidal ideation, auditory or visual hallucinations at the present time.  A: Scheduled medications are administered to patient as per MD orders. Emotional support and encouragement are provided. Patient is maintained on q.15 minute safety checks. Patient is informed to notify staff with questions or concerns. R: No adverse medication reactions are noted. Patient is cooperative with medication administration  and treatment plan today. Patient is receptive, calm and cooperative on the unit at this time. Patient has minimal interactions  with others on the unit this shift. Patient contracts for safety at this time. Patient remains safe at this time.

## 2016-04-21 NOTE — [facility] Group Notes (Signed)
[facility] LCSW Group Therapy  04/21/2016 2:41 PM  Type of Therapy:  Group Therapy  Participation Level:  Active  Participation Quality:  Attentive, Intrusive, Monopolizing and Redirectable  Affect:  Angry, Anxious, Irritable and Labile  Cognitive:  Disorganized  Insight:  Lacking  Engagement in Therapy:  Defensive, Engaged and Monopolizing  Modes of Intervention:  Confrontation, Discussion and Socialization  Summary of Progress/Problems: Patient attended and participated in group discussion but was irritable and off topic most of the time. Patient lacks insight to her problems and is focused on things such as her apartment and her family conflict rather than getting well. Patient shared that she was not happy with her psychiatrist and does not believe she has any mental health issues and is very angry about having to stay in the hospital.     Lulu RidingIngle, Delva Derden T, MSW, LCSW 04/21/2016, 2:41 PM

## 2016-04-21 NOTE — Plan of Care (Signed)
Problem[facility] Outcome: Progressing patient receptive to instruction at this point regarding medications [place] RN

## 2016-04-21 NOTE — Progress Notes (Signed)
D: Pt denies SI/HI/AVH. Pt is pleasant and cooperative, affect is flat, but brightens upon approach.  Pt appears less anxious, she is less irritable , calm and willing to participate in treatment plan. Thoughts are less disorganized. Patient is interacting with peers and staff appropriately.  A: Pt was offered support and encouragement. Pt was given scheduled medications. Pt was encouraged to attend groups. Q 15 minute checks were done for safety.  R: Pt attended group and interacted well with peers and staff. Pt is taking medication. Pt has no complaints.Pt receptive to treatment and safety maintained on unit.

## 2016-04-21 NOTE — Progress Notes (Signed)
Denies SI/HI/AVH.  Rates depression as a 4.  Visible in the milieu.  Support and encouragement offered.  Safety maintained.

## 2016-04-22 LAB — GLUCOSE, CAPILLARY
Glucose-Capillary: 149 mg/dL — ABNORMAL HIGH (ref 65–99)
Glucose-Capillary: 138 mg/dL — ABNORMAL HIGH (ref 65–99)
Glucose-Capillary: 89 mg/dL (ref 65–99)
Glucose-Capillary: 98 mg/dL (ref 65–99)

## 2016-04-22 NOTE — [facility] Group Notes (Signed)
[facility] Group Notes:  (Nursing/MHT/Case Management/Adjunct)  Date:  04/22/2016  Time:  2:19 PM  Type of Therapy:  Psychoeducational Skills  Participation Level:  Active  Participation Quality:  Appropriate and Sharing  Affect:  Flat and Irritable  Cognitive:  Appropriate  Insight:  Appropriate  Engagement in Group:  Defensive and Supportive  Modes of Intervention:  Discussion and Education  Summary of Progress/Problems:  Deanna [name] 04/22/2016, 2:19 PM

## 2016-04-22 NOTE — Tx Team (Signed)
Interdisciplinary Treatment Plan Update (Adult)  Date:  04/22/2016 Time Reviewed:  11:55 AM  Progress in Treatment: Attending groups: Yes. Participating in groups:  Yes. Taking medication as prescribed:  Yes. Tolerating medication:  Yes. Family/Significant othe contact made:  No, will contact:  if patient provides consent Patient understands diagnosis:  No. and As evidenced by:  patient is not able to talk about reason she need hospitalization but is very disorganized.  Discussing patient identified problems/goals with staff:  Yes. Medical problems stabilized or resolved:  Yes. Denies suicidal/homicidal ideation: Yes. Issues/concerns per patient self-inventory:  Yes. Other:  New problem(s) identified: No, Describe:  none reported  Discharge Plan or Barriers: Patient will stabilize and discharge home with [facility]patiet[facility]  Reason for Continuation of Hospitalization: Mania Medication stabilization Other; describe parania  Comments:  Estimated length of stay: up to 4 days, expected discharge Monday 04/21/16  New goal(s):  Review of initial/current patient goals per problem list:  1. Goal(s): Participate in aftercare plan   Met: No  Target date: by discharge  As evidenced by: patient will participate in aftercare plan AEB aftercare provider and housing plan identified at discharge 04/17/16: Patient can discharge home once stabilized but needs outpatient follow up in [facility]. 04/22/16: Goal progressing.  2. Goal (s): Decrease mania   Met: No  Target date: by discharge  As evidenced by: patient demonstrates decreased symptoms of mania 04/17/16: Patient is still manic and refusing meds and my have to take forced meds.  04/22/16: Goal progressing.  3. Goal (s): Decrease psychosis     Met: No  Target date: by discharge  As evidenced by: patient demonstrates decreased symptoms of psychosis 04/17/16: Patient is paranoid and  disorganized in her conversations.  04/22/16: Goal progressing.     Attendees: Attendees:  Patient: Family:  Physician: Dr. Bary Leriche, MD    04/22/2016 9:30 AM  Nursing: Polly Cobia, RN     04/22/2016 9:30 AM  Clinical Social Worker: Marylou Flesher, Jane  04/22/2016 9:30 AM  Clinical Social Worker: Carmell Austria, [place]  04/22/2016 9:30 AM  Recreational Therapist: Everitt Amber, LRT  04/22/2016 9:30 AM  Psychologist: Consuella Lose. Psy D  04/22/2016 9:30 AM  Nursing: Abran Cantor, RN   04/22/2016 9:30 AM                                                    Scribe for Treatment Team:   Claudine Mouton, MSW, LCSW  04/22/2016, 11:55 AM

## 2016-04-22 NOTE — Progress Notes (Signed)
[facility] MD Progress Note  04/22/2016 11:44 AM Deanna [name]  MRN:  [id number]  Subjective:  Deanna [name] met with her treatment team this morning.  She is still very disorganized in her thinking, argumentative, intrusive, unable to participate in discharge planning. She takes only half a dose of prescribed Trilafon. She demands to be discharged to home to take care of "her personal business". She is no more specific. I imagine that she has bills to pay at the beginning of the month but is not able to articulate her plans. There are no somatic complaints. Sleep and appetite are fair. She participates in programming but is disruptive in groups at times. She no longer talks to herself loudly or argues with her peers.  Principal Problem: Bipolar I disorder, most recent episode manic, severe with psychotic features ([place]) Diagnosis:   Patient Active Problem List   Diagnosis Date Noted  . Bipolar I disorder, most recent episode manic, severe with psychotic features ([place]) [F31.2] 04/16/2016  . Tobacco use disorder [F17.200] 04/16/2016  . Diabetes mellitus without complication ([place]) [O27.7] 04/15/2016  . Kidney disease [N28.9] 04/15/2016  . COPD (chronic obstructive pulmonary disease) ([place]) [J44.9] 04/15/2016  . Hypertension [I10] 04/15/2016  . Coronary artery disease [I25.10] 04/15/2016   Total Time spent with patient: 20 minutes  Past Psychiatric History: Bipolar disorder.  Past Medical History:  Past Medical History  Diagnosis Date  . Diabetes mellitus without complication ([place])   . Kidney disease   . COPD (chronic obstructive pulmonary disease) ([place])   . GERD (gastroesophageal reflux disease)    History reviewed. No pertinent past surgical history. Family History: History reviewed. No pertinent family history. Family Psychiatric  History: See H&P. Social History:  History  Alcohol Use No     History  Drug Use No    Social History   Social History  . Marital Status: Divorced   Spouse Name: N/A  . Number of Children: N/A  . Years of Education: N/A   Social History Main Topics  . Smoking status: Current Every Day Smoker -- 0.50 packs/day for 40 years    Types: Cigarettes  . Smokeless tobacco: None  . Alcohol Use: No  . Drug Use: No  . Sexual Activity: Not Currently    Birth Control/ Protection: None   Other Topics Concern  . None   Social History Narrative   Additional Social History:                         Sleep: Fair  Appetite:  Fair  Current Medications: Current Facility-Administered Medications  Medication Dose Route Frequency Provider Last Rate Last Dose  . acetaminophen (TYLENOL) tablet 650 mg  650 mg Oral Q6H PRN Gonzella Lex, MD      . albuterol (PROVENTIL) (2.5 MG/3ML) 0.083% nebulizer solution 3 mL  3 mL Inhalation Q4H PRN Gonzella Lex, MD      . alum & mag hydroxide-simeth (MAALOX/MYLANTA) 200-200-20 MG/5ML suspension 30 mL  30 mL Oral Q4H PRN Gonzella Lex, MD      . aspirin EC tablet 81 mg  81 mg Oral Daily Gonzella Lex, MD   81 mg at 04/22/16 0843  . cilostazol (PLETAL) tablet 50 mg  50 mg Oral BID Gonzella Lex, MD   100 mg at 04/21/16 2136  . clopidogrel (PLAVIX) tablet 75 mg  75 mg Oral Daily Gonzella Lex, MD   75 mg at 04/22/16 0842  .  diltiazem (CARDIZEM CD) 24 hr capsule 180 mg  180 mg Oral QHS Gonzella Lex, MD   180 mg at 04/21/16 2134  . enalapril (VASOTEC) tablet 2.5 mg  2.5 mg Oral Daily Gonzella Lex, MD   2.5 mg at 04/20/16 0852  . insulin aspart (novoLOG) injection 0-15 Units  0-15 Units Subcutaneous TID WC Jolanta B Pucilowska, MD   0 Units at 04/16/16 1148  . magnesium hydroxide (MILK OF MAGNESIA) suspension 30 mL  30 mL Oral Daily PRN Gonzella Lex, MD      . perphenazine (TRILAFON) tablet 8 mg  8 mg Oral BID Clovis Fredrickson, MD   4 mg at 04/22/16 0842  . rosuvastatin (CRESTOR) tablet 5 mg  5 mg Oral QHS Gonzella Lex, MD   5 mg at 04/21/16 2136  . temazepam (RESTORIL) capsule 15 mg  15 mg  Oral QHS Clovis Fredrickson, MD   15 mg at 04/20/16 2202    Lab Results:  Results for orders placed or performed during the hospital encounter of 04/15/16 (from the past 48 hour(s))  Glucose, capillary     Status: None   Collection Time: 04/20/16 11:53 AM  Result Value Ref Range   Glucose-Capillary 94 65 - 99 mg/dL   Comment 1 Notify RN   Glucose, capillary     Status: Abnormal   Collection Time: 04/20/16  4:09 PM  Result Value Ref Range   Glucose-Capillary 108 (H) 65 - 99 mg/dL  Glucose, capillary     Status: Abnormal   Collection Time: 04/21/16  6:48 AM  Result Value Ref Range   Glucose-Capillary 111 (H) 65 - 99 mg/dL  Glucose, capillary     Status: Abnormal   Collection Time: 04/21/16 11:51 AM  Result Value Ref Range   Glucose-Capillary 118 (H) 65 - 99 mg/dL  Glucose, capillary     Status: None   Collection Time: 04/21/16  4:54 PM  Result Value Ref Range   Glucose-Capillary 92 65 - 99 mg/dL   Comment 1 Notify RN    Comment 2 Document in Chart   Glucose, capillary     Status: Abnormal   Collection Time: 04/22/16  6:30 AM  Result Value Ref Range   Glucose-Capillary 149 (H) 65 - 99 mg/dL  Glucose, capillary     Status: Abnormal   Collection Time: 04/22/16 11:39 AM  Result Value Ref Range   Glucose-Capillary 138 (H) 65 - 99 mg/dL    Blood Alcohol level:  Lab Results  Component Value Date   ETH <5 04/15/2016    Physical Findings: AIMS: Facial and Oral Movements Muscles of Facial Expression: None, normal Lips and Perioral Area: None, normal Jaw: None, normal Tongue: None, normal,Extremity Movements Upper (arms, wrists, hands, fingers): None, normal Lower (legs, knees, ankles, toes): None, normal, Trunk Movements Neck, shoulders, hips: None, normal, Overall Severity Severity of abnormal movements (highest score from questions above): None, normal Incapacitation due to abnormal movements: None, normal Patient's awareness of abnormal movements (rate only patient's  report): No Awareness, Dental Status Current problems with teeth and/or dentures?: No Does patient usually wear dentures?: No  CIWA:  CIWA-Ar Total: 7 COWS:     Musculoskeletal: Strength & Muscle Tone: within normal limits Gait & Station: normal Patient leans: N/A  Psychiatric Specialty Exam: Review of Systems  Psychiatric/Behavioral: Positive for hallucinations.  All other systems reviewed and are negative.   Blood pressure 132/75, pulse 89, temperature 97.7 F (36.5 C), temperature source Oral, resp.  rate 20, height 5' 3.98" (1.625 m), weight 68.04 kg (150 lb), SpO2 99 %.Body mass index is 25.77 kg/(m^2).  General Appearance: Casual  Eye Contact::  Good  Speech:  Pressured  Volume:  Increased  Mood:  Angry, Dysphoric and Irritable  Affect:  Congruent, Inappropriate and Labile  Thought Process:  Disorganized  Orientation:  Full (Time, Place, and Person)  Thought Content:  Delusions and Paranoid Ideation  Suicidal Thoughts:  No  Homicidal Thoughts:  No  Memory:  Immediate;   Fair Recent;   Fair Remote;   Fair  Judgement:  Poor  Insight:  Lacking  Psychomotor Activity:  Increased  Concentration:  Fair  Recall:  [employer] of Knowledge:Fair  Language: Fair  Akathisia:  No  Handed:  Right  AIMS (if indicated):     Assets:  Communication Skills Desire for Improvement Financial Resources/Insurance Housing Physical Health Resilience  ADL's:  Intact  Cognition: WNL  Sleep:  Number of Hours: 6.15   Treatment Plan Summary: Daily contact with patient to assess and evaluate symptoms and progress in treatment and Medication management   Ms. Kintz is a 65 year old female with a history of bipolar disorder admitted in a manic psychotic episode in the context of treatment noncompliance.  1. Psychosis. She was restarted on perphenazine. We tried to increase dose to 8 mg twice daily but the patient takes 4 mg only. She refuses a mood stabilizer.   2. Insomnia. Slept did  not respond well to Trazodone. We started Restoril.  3. Smoking. Nicotine patch is available.  4. Hypertension. She is on Cardizem and enalapril.  5. Coronary artery disease. She is on aspirin, Plavix, Crestor, and Pletal.  6. Diabetes. She is taking Trulicity at home. We will start sliding scale insulin, blood glucose monitoring, ADA diet.  7. COPD. She is on inhaler.  8. Metabolic syndrome monitoring. Lipid profile, hemoglobin A1c, TSH are normal. Prolactin 15.7.  9. She will be discharged to home. She needs follow-up appointment with a new psychiatrist.  Orson Slick, MD 04/22/2016, 11:44 AM

## 2016-04-22 NOTE — Progress Notes (Signed)
D:  Per pt self inventory pt reports sleeping good, appetite good, energy level normal, ability to pay attention good, rates depression at a 3 out of 10, hopelessness at a 0 out of 10, anxiety at a 2 out of 10, denies SI/HI/AVH, goal today: "d/c to attend to personal matters related to current situation", angry, irritable and argumentative during interaction.    A:  Emotional support provided, Encouraged pt to continue with treatment plan and attend all group activities, q15 min checks maintained for safety.  R:  Pt is not receptive, going to some groups, agitates some of the other patients on the unit.

## 2016-04-22 NOTE — Progress Notes (Signed)
Recreation Therapy Notes  Date: 05.02.17 Time: 9:30 am Location: Craft Room  Group Topic: Goal Setting  Goal Area(s) Addresses:  Patient will be able to identify a personal goal. Patient will be able to identify a supportive statement.  Behavioral Response: Attentive  Intervention: Step By Step  Activity: Patients were given a foot worksheet and instructed to write a goal inside the foot and to write supportive statements outside of the foot.  Education: LRT educated patients on why it is important to set goals.  Education Outcome: In group clarification offered  Clinical Observations/Feedback: Patient wrote letters inside her foot and colored it. Patient stated the supportive statements were inside the foot as well. Patient talked about what she could do because she does not trust her doctor. LRT informed patients that all the doctors here were well qualified and good doctors. Patient agreed.  Jacquelynn CreeGreene,Pinkney Venard M, LRT/CTRS 04/22/2016 10:17 AM

## 2016-04-23 LAB — GLUCOSE, CAPILLARY
Glucose-Capillary: 183 mg/dL — ABNORMAL HIGH (ref 65–99)
Glucose-Capillary: 166 mg/dL — ABNORMAL HIGH (ref 65–99)
Glucose-Capillary: 99 mg/dL (ref 65–99)

## 2016-04-23 NOTE — Progress Notes (Signed)
Recreation Therapy Notes   Date: 05.03.17 Time: 9:30 am Location: Craft Room  Group Topic: Self-esteem  Goal Area(s) Addresses:  Patient will write positive trait about self. Patient will verbalize benefit of healthy self-esteem.  Behavioral Response: Attentive, Off topic  Intervention: Positive Reinforcement  Activity: Patients were given construction paper and encouraged to write as many positive traits about themselves.  Education: LRT educated patients on ways they can increase their self-esteem.  Education Outcome: In group clarification offered  Clinical Observations/Feedback: Patient completed activity by writing positive traits. Patient stated towards the end of group, "This is the way the president signs documents. I'm just now catching up. I didn't watch the inauguration. I refused."  Jacquelynn CreeGreene,Amilliana Hayworth M, LRT/CTRS 04/23/2016 10:20 AM

## 2016-04-23 NOTE — Progress Notes (Signed)
[facility]  04/23/2016 2:14 PM Deanna [name]  MRN:  [id number]  Subjective:  Deanna [name] is still disorganized and argumentative. She only takes half of the prescribed dose of medications. She demands to be discharged but is absolutely unable to participate in discharge planning. She goes less frequently off topic. This morning she insisted to discuss residential inauguration that she did not watch. There are no somatic complaints. Sleep and appetite are good.  Principal Problem: Bipolar I disorder, most recent episode manic, severe with psychotic features (HCC) Diagnosis:   Patient Active Problem List   Diagnosis Date Noted  . Bipolar I disorder, most recent episode manic, severe with psychotic features (HCC) [F31.2] 04/16/2016  . Tobacco use disorder [F17.200] 04/16/2016  . Diabetes mellitus without complication (HCC) [E11.9] 04/15/2016  . Kidney disease [N28.9] 04/15/2016  . COPD (chronic obstructive pulmonary disease) (HCC) [J44.9] 04/15/2016  . Hypertension [I10] 04/15/2016  . Coronary artery disease [I25.10] 04/15/2016   Total Time spent with patient: 20 minutes  Past Psychiatric History: Bipolar disorder.  Past Medical History:  Past Medical History  Diagnosis Date  . Diabetes mellitus without complication (HCC)   . Kidney disease   . COPD (chronic obstructive pulmonary disease) (HCC)   . GERD (gastroesophageal reflux disease)    History reviewed. No pertinent past surgical history. Family History: History reviewed. No pertinent family history. Family Psychiatric  History: None reported. Social History:  History  Alcohol Use No     History  Drug Use No    Social History   Social History  . Marital Status: Divorced    Spouse Name: N/A  . Number of Children: N/A  . Years of Education: N/A   Social History Main Topics  . Smoking status: Current Every Day Smoker -- 0.50 packs/day for 40 years    Types: Cigarettes  . Smokeless tobacco: None  .  Alcohol Use: No  . Drug Use: No  . Sexual Activity: Not Currently    Birth Control/ Protection: None   Other Topics Concern  . None   Social History Narrative   Additional Social History:                         Sleep: Fair  Appetite:  Fair  Current Medications: Current Facility-Administered Medications  Medication Dose Route Frequency Provider Last Rate Last Dose  . acetaminophen (TYLENOL) tablet 650 mg  650 mg Oral Q6H PRN Audery AmelJohn T Clapacs, MD      . albuterol (PROVENTIL) (2.5 MG/3ML) 0.083% nebulizer solution 3 mL  3 mL Inhalation Q4H PRN Audery AmelJohn T Clapacs, MD      . alum & mag hydroxide-simeth (MAALOX/MYLANTA) 200-200-20 MG/5ML suspension 30 mL  30 mL Oral Q4H PRN Audery AmelJohn T Clapacs, MD      . aspirin EC tablet 81 mg  81 mg Oral Daily Audery AmelJohn T Clapacs, MD   81 mg at 04/23/16 0848  . cilostazol (PLETAL) tablet 50 mg  50 mg Oral BID Audery AmelJohn T Clapacs, MD   50 mg at 04/22/16 2129  . clopidogrel (PLAVIX) tablet 75 mg  75 mg Oral Daily Audery AmelJohn T Clapacs, MD   75 mg at 04/23/16 0848  . diltiazem (CARDIZEM CD) 24 hr capsule 180 mg  180 mg Oral QHS Audery AmelJohn T Clapacs, MD   180 mg at 04/22/16 2130  . enalapril (VASOTEC) tablet 2.5 mg  2.5 mg Oral Daily Audery AmelJohn T Clapacs, MD   2.5 mg at 04/20/16 0852  .  insulin aspart (novoLOG) injection 0-15 Units  0-15 Units Subcutaneous TID WC Lindy Pennisi B Aidee Latimore, MD   0 Units at 04/16/16 1148  . magnesium hydroxide (MILK OF MAGNESIA) suspension 30 mL  30 mL Oral Daily PRN Audery Amel, MD      . perphenazine (TRILAFON) tablet 8 mg  8 mg Oral BID Shari Prows, MD   4 mg at 04/23/16 0848  . rosuvastatin (CRESTOR) tablet 5 mg  5 mg Oral QHS Audery Amel, MD   5 mg at 04/22/16 2129  . temazepam (RESTORIL) capsule 15 mg  15 mg Oral QHS Shari Prows, MD   15 mg at 04/20/16 2202    Lab Results:  Results for orders placed or performed during the hospital encounter of 04/15/16 (from the past 48 hour(s))  Glucose, capillary     Status: None    Collection Time: 04/21/16  4:54 PM  Result Value Ref Range   Glucose-Capillary 92 65 - 99 mg/dL   Comment 1 Notify RN    Comment 2 Document in Chart   Glucose, capillary     Status: Abnormal   Collection Time: 04/22/16  6:30 AM  Result Value Ref Range   Glucose-Capillary 149 (H) 65 - 99 mg/dL  Glucose, capillary     Status: Abnormal   Collection Time: 04/22/16 11:39 AM  Result Value Ref Range   Glucose-Capillary 138 (H) 65 - 99 mg/dL  Glucose, capillary     Status: None   Collection Time: 04/22/16  4:40 PM  Result Value Ref Range   Glucose-Capillary 89 65 - 99 mg/dL  Glucose, capillary     Status: None   Collection Time: 04/22/16  8:29 PM  Result Value Ref Range   Glucose-Capillary 98 65 - 99 mg/dL  Glucose, capillary     Status: Abnormal   Collection Time: 04/23/16  6:25 AM  Result Value Ref Range   Glucose-Capillary 183 (H) 65 - 99 mg/dL  Glucose, capillary     Status: Abnormal   Collection Time: 04/23/16 11:55 AM  Result Value Ref Range   Glucose-Capillary 166 (H) 65 - 99 mg/dL    Blood Alcohol level:  Lab Results  Component Value Date   ETH <5 04/15/2016    Physical Findings: AIMS: Facial and Oral Movements Muscles of Facial Expression: None, normal Lips and Perioral Area: None, normal Jaw: None, normal Tongue: None, normal,Extremity Movements Upper (arms, wrists, hands, fingers): None, normal Lower (legs, knees, ankles, toes): None, normal, Trunk Movements Neck, shoulders, hips: None, normal, Overall Severity Severity of abnormal movements (highest score from questions above): None, normal Incapacitation due to abnormal movements: None, normal Patient's awareness of abnormal movements (rate only patient's report): No Awareness, Dental Status Current problems with teeth and/or dentures?: No Does patient usually wear dentures?: No  CIWA:  CIWA-Ar Total: 7 COWS:     Musculoskeletal: Strength & Muscle Tone: within normal limits Gait & Station: normal Patient  leans: N/A  Psychiatric Specialty Exam: Review of Systems  Psychiatric/Behavioral: Positive for hallucinations.  All other systems reviewed and are negative.   Blood pressure 127/69, pulse 92, temperature 98.7 F (37.1 C), temperature source Oral, resp. rate 20, height 5' 3.98" (1.625 m), weight 68.04 kg (150 lb), SpO2 99 %.Body mass index is 25.77 kg/(m^2).  General Appearance: Casual  Eye Contact::  Good  Speech:  Clear and Coherent  Volume:  Normal  Mood:  Angry, Dysphoric and Irritable  Affect:  Appropriate  Thought Process:  Disorganized  Orientation:  Full (Time, Place, and Person)  Thought Content:  Delusions and Paranoid Ideation  Suicidal Thoughts:  No  Homicidal Thoughts:  No  Memory:  Immediate;   Fair Recent;   Fair Remote;   Fair  Judgement:  Poor  Insight:  Lacking  Psychomotor Activity:  Normal  Concentration:  Fair  Recall:  [employer] of Knowledge:Fair  Language: Fair  Akathisia:  No  Handed:  Right  AIMS (if indicated):     Assets:  Communication Skills Desire for Improvement Financial Resources/Insurance Housing Physical Health Resilience  ADL's:  Intact  Cognition: WNL  Sleep:  Number of Hours: 6.45   Treatment Plan Summary: Daily contact with patient to assess and evaluate symptoms and progress in treatment and Medication management   Ms. Drury is a 65 year old female with a history of bipolar disorder admitted in a manic psychotic episode in the context of treatment noncompliance.  1. Psychosis. She was restarted on perphenazine and tried to increase dose to 8 mg twice daily but the patient takes 4 mf only. She refuses a mood stabilizer.   2. Insomnia. Slept did not respond well to Trazodone. We started Restoril.  3. Smoking. Nicotine patch is available.  4. Hypertension. She is on Cardizem and enalapril.  5. Coronary artery disease. She is on aspirin, Plavix, Crestor, and Pletal.  6. Diabetes. She is taking to Trulicity at home. We  will start sliding scale insulin, blood glucose monitoring, ADA diet.  7. COPD. She is on inhaler.  8. Metabolic syndrome monitoring. Lipid profile, hemoglobin A1c, TSH are normal. Prolactin 15.7.  9. She will be discharged to home. She needs follow-up appointment with a new psychiatrist.  Kristine Linea, MD 04/23/2016, 2:14 PM

## 2016-04-23 NOTE — [facility] Group Notes (Signed)
[facility] LCSW Group Therapy  04/23/2016 11:36 AM  Type of Therapy:  Group Therapy  Participation Level:  Active  Participation Quality:  Monopolizing and Redirectable  Affect:  Irritable and Labile  Cognitive:  Disorganized  Insight:  Limited and Monopolizing  Engagement in Therapy:  Limited and Monopolizing  Modes of Intervention:  Discussion, Socialization and Support  Summary of Progress/Problems: Patient attended and participated in group discussion but was irritable but redirectable. Patient participated by sharing during an introductory exercise and introducing herself to the group. Patient reports she does not have Balance in Life because she wants to discharge and go home to pay her bills and get her car back. Patient lacks insight to see that she needs further mediation treatment.    Lulu RidingIngle, Milinda Sweeney T, MSW, LCSW 04/23/2016, 11:36 AM

## 2016-04-23 NOTE — Progress Notes (Signed)
D:  Per pt self inventory pt reports sleeping good, appetite good, energy level normal, rates depression at a 3 out of 10, hopelessness at a 0 out of 10, anxiety at a 3 out of 10, denies SI/HI/AVH, goal today: "Talk to the doctor, I need to take care of some family matters outside of here", pt frequently talking to self, tangential, irritable at times.     A:  Emotional support provided, Encouraged pt to continue with treatment plan and attend all group activities, q15 min checks maintained for safety.  R:  Pt i\refuses insulin, MD aware, going to groups,otherwise has cooperative with staff and other patients on the unit today.

## 2016-04-23 NOTE — Progress Notes (Signed)
D: Patient is alert and oriented on the unit this shift. Patient attended group  today. Patient denies suicidal ideation, homicidal ideation, auditory or visual hallucinations at the present time.  A: Scheduled medications are administered to patient as per MD orders. Emotional support and encouragement are provided. Patient is maintained on q.15 minute safety checks. Patient is informed to notify staff with questions or concerns. R: No adverse medication reactions are noted. Patient is not cooperative with medication administration and treatment plan today. Patient is unreceptive, anxious and uncooperative on the unit at this time. Patient interacts l with others on the unit this shift but minimally   Patient contracts for safety at this time. Patient remains safe at this time.

## 2016-04-23 NOTE — [facility] Group Notes (Signed)
[facility] LCSW Group Therapy  04/23/2016 11:40 AM  Type of Therapy:  Group Therapy  Participation Level:  Active  Participation Quality:  Monopolizing and Redirectable  Affect:  Labile  Cognitive:  Disorganized  Insight:  Developing/Improving  Engagement in Therapy:  Limited and Monopolizing  Modes of Intervention:  Discussion, Socialization and Support  Summary of Progress/Problems: Patient arrived late to group and was improved from yesterday but still manic. Patient was distracting group stating she wanted to make things less serious but was able to offer support to other group members who were struggling with forgiveness and grudges.    Deanna [name], Deanna [name], MSW, LCSW 04/23/2016, 11:40 AM

## 2016-04-23 NOTE — Progress Notes (Signed)
Patient refused Restoril 15 mg and trilafon 4 mg po at 2129. Will continue to monitor for safety.

## 2016-04-23 NOTE — [facility] Group Notes (Signed)
[facility]   04/23/2016 11:36 AM  Participation Quality:   Patient was called to group but did not attend.     Lulu RidingIngle, Tocara Mennen T, MSW, LCSW

## 2016-04-23 NOTE — Plan of Care (Signed)
Problem: Ineffective individual coping Goal: STG: Patient will remain free from self harm Outcome: Not Met (add Reason)  Pt remains argumentative and hostile towards staff. Disorganized and confused. Pt refused Restoril 15 mg and Trilafon 4 mg po bedtime medications. No voiced thoughts of hurting herself. Will continue to monitor for safety and behavior.

## 2016-04-23 NOTE — Plan of Care (Signed)
Problem: Consults Goal[facility] Patient Education Outcome: Not Progressing Patient not receptive to any education [place] RN

## 2016-04-24 LAB — GLUCOSE, CAPILLARY
Glucose-Capillary: 137 mg/dL — ABNORMAL HIGH (ref 65–99)
Glucose-Capillary: 87 mg/dL (ref 65–99)
Glucose-Capillary: 92 mg/dL (ref 65–99)
Glucose-Capillary: 180 mg/dL — ABNORMAL HIGH (ref 65–99)

## 2016-04-24 MED ORDER — TEMAZEPAM 15 MG PO CAPS: 15.0000 mg | ORAL_CAPSULE | Freq: Every day | ORAL | Status: DC

## 2016-04-24 MED ORDER — TRAZODONE HCL 100 MG PO TABS
100.0000 mg | ORAL_TABLET | Freq: Every day | ORAL | Status: DC
  Filled 2016-04-24: qty 1

## 2016-04-24 MED ORDER — PERPHENAZINE 4 MG PO TABS
4.0000 mg | ORAL_TABLET | Freq: Two times a day (BID) | ORAL | Status: DC
  Administered 2016-04-24 – 2016-04-25 (×2): 4 mg via ORAL
  Filled 2016-04-24 (×2): qty 1

## 2016-04-24 MED ORDER — PERPHENAZINE 4 MG PO TABS: 4.0000 mg | ORAL_TABLET | Freq: Two times a day (BID) | ORAL | Status: DC

## 2016-04-24 NOTE — Progress Notes (Signed)
Recreation Therapy Notes  Date: 05.04.17 Time: 9:30 am Location: Craft Room  Group Topic: Leisure Education  Goal Area(s) Addresses:  Patient will identify things they are grateful for.  Behavioral Response: Attentive, Interactive  Intervention: Grateful Wheel  Activity: Patients were given an "I Am Grateful For" worksheet and instructed to write things they are grateful for under each category.  Education: LRT educated patients on why it is important to be grateful.  Education Outcome: In group clarification offered  Clinical Observations/Feedback: Patient participated in activity by saying things she was grateful for. Patient wrote some of them down.  Jacquelynn CreeGreene,Tiffaney Heimann M, LRT/CTRS 04/24/2016 10:23 AM

## 2016-04-24 NOTE — Plan of Care (Signed)
Problem: Ineffective individual coping Goal: STG: Patient will remain free from self harm Outcome: Progressing No self harm reported or observed     

## 2016-04-24 NOTE — Tx Team (Signed)
Interdisciplinary Treatment Plan Update (Adult)  Date:  04/24/2016 Time Reviewed:  10:58 AM  Progress in Treatment: Attending groups: Yes. Participating in groups:  Yes. Taking medication as prescribed:  Yes. Tolerating medication:  Yes. Family/Significant othe contact made:  No, will contact:  if patient provides consent Patient understands diagnosis:  No. and As evidenced by:  patient is not able to talk about reason she need hospitalization but is very disorganized.  Discussing patient identified problems/goals with staff:  Yes. Medical problems stabilized or resolved:  Yes. Denies suicidal/homicidal ideation: Yes. Issues/concerns per patient self-inventory:  Yes. Other:  New problem(s) identified: No, Describe:  none reported  Discharge Plan or Barriers: Patient will stabilize and discharge home with outpatietn follow up in [facility]  Reason for Continuation of Hospitalization: Mania Medication stabilization Other; describe parania  Comments:  Estimated length of stay: up to 4 days, expected discharge Monday 04/21/16  New goal(s):  Review of initial/current patient goals per problem list:  1. Goal(s): Participate in aftercare plan   Met: No  Target date: by discharge  As evidenced by: patient will participate in aftercare plan AEB aftercare provider and housing plan identified at discharge 04/17/16: Patient can discharge home once stabilized but needs outpatient follow up in [facility]. 04/22/16: Goal progressing. 04/24/16: Goal progressing.  2. Goal (s): Decrease mania   Met: No  Target date: by discharge  As evidenced by: patient demonstrates decreased symptoms of mania 04/17/16: Patient is still manic and refusing meds and my have to take forced meds.  04/22/16: Goal progressing. 04/24/16: Goal progressing.  3. Goal (s): Decrease psychosis     Met: No  Target date: by discharge  As evidenced by: patient demonstrates decreased symptoms of  psychosis 04/17/16: Patient is paranoid and disorganized in her conversations.  04/22/16: Goal progressing. 04/24/16: Goal progressing.     Attendees: Attendees:  Patient: Deanna [name] Family:  Physician: Dr. Bary Leriche, MD    04/24/2016 9:30 AM  Nursing: Carolynn Sayers, RN    04/24/2016 9:30 AM  Clinical Social Worker: Marylou Flesher, Johnson  04/24/2016 9:30 AM  Clinical Social Worker: Carmell Austria, LCSW  04/24/2016 9:30 AM  Recreational Therapist: Everitt Amber, LRT   04/24/2016 9:30 AM  Other:        04/24/2016 9:30 AM  Other:        04/24/2016 9:30 AM                                                      Scribe for Treatment Team:   Claudine Mouton, MSW, LCSW  04/24/2016, 10:58 AM   Alphonse Guild. Tiffiany Beadles, LCSWA, LCAS  04/24/16

## 2016-04-24 NOTE — [facility] Suicide Risk Assessment (Addendum)
[facility]   Principal Problem: Bipolar I disorder, most recent episode manic, severe with psychotic features [facility](HCC) Discharge Diagnoses:  Patient Active Problem List   Diagnosis Date Noted  . Bipolar I disorder, most recent episode manic, severe with psychotic features (HCC) [F31.2] 04/16/2016  . Tobacco use disorder [F17.200] 04/16/2016  . Diabetes mellitus without complication (HCC) [E11.9] 04/15/2016  . Kidney disease [N28.9] 04/15/2016  . COPD (chronic obstructive pulmonary disease) (HCC) [J44.9] 04/15/2016  . Hypertension [I10] 04/15/2016  . Coronary artery disease [I25.10] 04/15/2016    Total Time spent with patient: 30 minutes  Musculoskeletal: Strength & Muscle Tone: within normal limits Gait & Station: normal Patient leans: N/A  Psychiatric Specialty Exam: Review of Systems  All other systems reviewed and are negative.   Blood pressure 143/59, pulse 84, temperature 98.5 F (36.9 C), temperature source Oral, resp. rate 20, height 5' 3.98" (1.625 m), weight 68.04 kg (150 lb), SpO2 99 %.Body mass index is 25.77 kg/(m^2).  General Appearance: Casual  Eye Contact::  Good  Speech:  Clear and Coherent409  Volume:  Normal  Mood:  Euthymic  Affect:  Appropriate  Thought Process:  Goal Directed  Orientation:  Full (Time, Place, and Person)  Thought Content:  WDL  Suicidal Thoughts:  No  Homicidal Thoughts:  No  Memory:  Immediate;   Fair Recent;   Fair Remote;   Fair  Judgement:  Impaired  Insight:  Shallow  Psychomotor Activity:  Normal  Concentration:  Fair  Recall:  [employer]  Fund of Knowledge:Fair  Language: Fair  Akathisia:  No  Handed:  Right  AIMS (if indicated):     Assets:  Communication Skills Desire for Improvement Financial Resources/Insurance Housing Physical Health Resilience  Sleep:  Number of Hours: 4.48  Cognition: WNL  ADL's:  Intact   Mental Status Per Nursing Assessment::   On Admission:  NA  Demographic  Factors:  Divorced or widowed, Caucasian and Living alone  Loss Factors: NA  Historical Factors: Impulsivity  Risk Reduction Factors:   Sense of responsibility to family and Positive social support  Continued Clinical Symptoms:  Bipolar Disorder:   Mixed State  Cognitive Features That Contribute To Risk:  None    Suicide Risk:  Minimal: No identifiable suicidal ideation.  Patients presenting with no risk factors but with morbid ruminations; may be classified as minimal risk based on the severity of the depressive symptoms    Plan Of Care/Follow-up recommendations:  Activity:  as tolerated. Diet:  low sodium heart healthy. Other:  keep follow up appointment.  Kristine LineaJolanta Pucilowska, MD 04/24/2016, 6:57 PM

## 2016-04-24 NOTE — Progress Notes (Signed)
Patient refused pm cbg check tonight [place] RN

## 2016-04-24 NOTE — Progress Notes (Signed)
[facility]  04/24/2016 12:26 PM Deanna [name]  MRN:  [id number]  Subjective:  Deanna [name] is much more cooperative and reasonable today. She is able to discuss details of her discharge planning in our treatment team meeting. In fact, she was able to pay her rent by the phone this morning. She was sensible on the phone with her landlord. She is no longer paranoid about her upstairs neighbors or asbestos in the house. She tolerates medications well although she accepts only 4 mg of Trilafon. There are no somatic complaints. Sleep and appetite are good. This is a pretty radical improvement since yesterday. If this continues with discharge tomorrow.  Principal Problem: Bipolar I disorder, most recent episode manic, severe with psychotic features (HCC) Diagnosis:   Patient Active Problem List   Diagnosis Date Noted  . Bipolar I disorder, most recent episode manic, severe with psychotic features (HCC) [F31.2] 04/16/2016  . Tobacco use disorder [F17.200] 04/16/2016  . Diabetes mellitus without complication (HCC) [E11.9] 04/15/2016  . Kidney disease [N28.9] 04/15/2016  . COPD (chronic obstructive pulmonary disease) (HCC) [J44.9] 04/15/2016  . Hypertension [I10] 04/15/2016  . Coronary artery disease [I25.10] 04/15/2016   Total Time spent with patient: 20 minutes  Past Psychiatric History: Bipolar disorder.  Past Medical History:  Past Medical History  Diagnosis Date  . Diabetes mellitus without complication (HCC)   . Kidney disease   . COPD (chronic obstructive pulmonary disease) (HCC)   . GERD (gastroesophageal reflux disease)    History reviewed. No pertinent past surgical history. Family History: History reviewed. No pertinent family history. Family Psychiatric  History: See H&P. Social History:  History  Alcohol Use No     History  Drug Use No    Social History   Social History  . Marital Status: Divorced    Spouse Name: N/A  . Number of Children: N/A  . Years of  Education: N/A   Social History Main Topics  . Smoking status: Current Every Day Smoker -- 0.50 packs/day for 40 years    Types: Cigarettes  . Smokeless tobacco: None  . Alcohol Use: No  . Drug Use: No  . Sexual Activity: Not Currently    Birth Control/ Protection: None   Other Topics Concern  . None   Social History Narrative   Additional Social History:                         Sleep: Fair  Appetite:  Fair  Current Medications: Current Facility-Administered Medications  Medication Dose Route Frequency Provider Last Rate Last Dose  . acetaminophen (TYLENOL) tablet 650 mg  650 mg Oral Q6H PRN Audery AmelJohn T Clapacs, MD      . albuterol (PROVENTIL) (2.5 MG/3ML) 0.083% nebulizer solution 3 mL  3 mL Inhalation Q4H PRN Audery AmelJohn T Clapacs, MD      . alum & mag hydroxide-simeth (MAALOX/MYLANTA) 200-200-20 MG/5ML suspension 30 mL  30 mL Oral Q4H PRN Audery AmelJohn T Clapacs, MD      . aspirin EC tablet 81 mg  81 mg Oral Daily Audery AmelJohn T Clapacs, MD   81 mg at 04/24/16 0903  . cilostazol (PLETAL) tablet 50 mg  50 mg Oral BID Audery AmelJohn T Clapacs, MD   50 mg at 04/24/16 0903  . clopidogrel (PLAVIX) tablet 75 mg  75 mg Oral Daily Audery AmelJohn T Clapacs, MD   75 mg at 04/24/16 0904  . diltiazem (CARDIZEM CD) 24 hr capsule 180 mg  180  mg Oral QHS Audery Amel, MD   180 mg at 04/23/16 2136  . enalapril (VASOTEC) tablet 2.5 mg  2.5 mg Oral Daily Audery Amel, MD   2.5 mg at 04/24/16 0903  . insulin aspart (novoLOG) injection 0-15 Units  0-15 Units Subcutaneous TID WC Carmel Waddington B Trystian Crisanto, MD   0 Units at 04/16/16 1148  . magnesium hydroxide (MILK OF MAGNESIA) suspension 30 mL  30 mL Oral Daily PRN Audery Amel, MD      . perphenazine (TRILAFON) tablet 4 mg  4 mg Oral BID Jaliza Seifried B Lexander Tremblay, MD      . rosuvastatin (CRESTOR) tablet 5 mg  5 mg Oral QHS Audery Amel, MD   5 mg at 04/22/16 2129  . temazepam (RESTORIL) capsule 15 mg  15 mg Oral QHS Shari Prows, MD   15 mg at 04/23/16 2138    Lab Results:   Results for orders placed or performed during the hospital encounter of 04/15/16 (from the past 48 hour(s))  Glucose, capillary     Status: None   Collection Time: 04/22/16  4:40 PM  Result Value Ref Range   Glucose-Capillary 89 65 - 99 mg/dL  Glucose, capillary     Status: None   Collection Time: 04/22/16  8:29 PM  Result Value Ref Range   Glucose-Capillary 98 65 - 99 mg/dL  Glucose, capillary     Status: Abnormal   Collection Time: 04/23/16  6:25 AM  Result Value Ref Range   Glucose-Capillary 183 (H) 65 - 99 mg/dL  Glucose, capillary     Status: Abnormal   Collection Time: 04/23/16 11:55 AM  Result Value Ref Range   Glucose-Capillary 166 (H) 65 - 99 mg/dL  Glucose, capillary     Status: None   Collection Time: 04/23/16  4:21 PM  Result Value Ref Range   Glucose-Capillary 99 65 - 99 mg/dL   Comment 1 Notify RN   Glucose, capillary     Status: Abnormal   Collection Time: 04/24/16  4:58 AM  Result Value Ref Range   Glucose-Capillary 137 (H) 65 - 99 mg/dL  Glucose, capillary     Status: None   Collection Time: 04/24/16 11:01 AM  Result Value Ref Range   Glucose-Capillary 87 65 - 99 mg/dL    Blood Alcohol level:  Lab Results  Component Value Date   ETH <5 04/15/2016    Physical Findings: AIMS: Facial and Oral Movements Muscles of Facial Expression: None, normal Lips and Perioral Area: None, normal Jaw: None, normal Tongue: None, normal,Extremity Movements Upper (arms, wrists, hands, fingers): None, normal Lower (legs, knees, ankles, toes): None, normal, Trunk Movements Neck, shoulders, hips: None, normal, Overall Severity Severity of abnormal movements (highest score from questions above): None, normal Incapacitation due to abnormal movements: None, normal Patient's awareness of abnormal movements (rate only patient's report): No Awareness, Dental Status Current problems with teeth and/or dentures?: No Does patient usually wear dentures?: No  CIWA:  CIWA-Ar Total:  7 COWS:     Musculoskeletal: Strength & Muscle Tone: within normal limits Gait & Station: normal Patient leans: N/A  Psychiatric Specialty Exam: Review of Systems  All other systems reviewed and are negative.   Blood pressure 143/59, pulse 84, temperature 98.5 F (36.9 C), temperature source Oral, resp. rate 20, height 5' 3.98" (1.625 m), weight 68.04 kg (150 lb), SpO2 99 %.Body mass index is 25.77 kg/(m^2).  General Appearance: Casual  Eye Contact::  Good  Speech:  Clear and  Coherent  Volume:  Normal  Mood:  Euthymic  Affect:  Appropriate  Thought Process:  Goal Directed  Orientation:  Full (Time, Place, and Person)  Thought Content:  WDL  Suicidal Thoughts:  No  Homicidal Thoughts:  No  Memory:  Immediate;   Fair Recent;   Fair Remote;   Fair  Judgement:  Impaired  Insight:  Shallow  Psychomotor Activity:  Normal  Concentration:  Fair  Recall:  [employer] of Knowledge:Fair  Language: Fair  Akathisia:  No  Handed:  Right  AIMS (if indicated):     Assets:  Communication Skills Desire for Improvement Financial Resources/Insurance Housing Physical Health Resilience Social Support  ADL's:  Intact  Cognition: WNL  Sleep:  Number of Hours: 4.48   Treatment Plan Summary: Daily contact with patient to assess and evaluate symptoms and progress in treatment and Medication management   Ms. Boniface is a 65 year old female with a history of bipolar disorder admitted in a manic psychotic episode in the context of treatment noncompliance.  1. Psychosis. She was restarted on perphenazine. She refuses higher dose and I will cut it back to 4 mg bid. She refuses a mood stabilizer.   2. Insomnia. She slept 5 hours with Restoril. We will add Trazodone.  3. Smoking. Nicotine patch is available.  4. Hypertension. She is on Cardizem and enalapril.  5. Coronary artery disease. She is on aspirin, Plavix, Crestor, and Pletal.  6. Diabetes. She is taking to Trulicity at home  and refuses insulin in the hospital. She is on sliding scale insulin, blood glucose monitoring, ADA diet.  7. COPD. She is on inhaler.  8. Metabolic syndrome monitoring. Lipid profile, hemoglobin A1c, TSH are normal. Prolactin 15.7.  9. She will be discharged to home. She needs follow-up appointment with a new psychiatrist.  Kristine Linea, MD 04/24/2016, 12:26 PM

## 2016-04-24 NOTE — [facility] Group Notes (Signed)
[facility] Group Notes:  (Nursing/MHT/Case Management/Adjunct)  Date:  04/24/2016  Time:  4:41 AM  Type of Therapy:  Group Therapy  Participation Level:  Active  Participation Quality:  Resistant  Affect:  Defensive  Cognitive:  Alert  Insight:  Lacking  Engagement in Group:  Lacking and Off Topic  Modes of Intervention:  Discussion  Summary of Progress/Problems: Pt was upset about her car and was very defensive when asked about her goal. Staff attempted to redirect pt from the topic of her car, but she did not respond well.   Deanna [name] 04/24/2016, 4:41 AM

## 2016-04-24 NOTE — [facility] Group Notes (Signed)
[facility] Group Notes:  (Nursing/MHT/Case Management/Adjunct)  Date:  04/24/2016  Time:  4:10 PM  Type of Therapy:  Group Therapy  Participation Level:  Active  Participation Quality:  Attentive, Monopolizing and Redirectable  Affect:  Defensive and Labile  Cognitive:  Disorganized  Insight:  Improving  Engagement in Group:  Improving  Modes of Intervention:  Activity  Summary of Progress/Problems:  Deanna [name] [name] 04/24/2016, 4:10 PM

## 2016-04-24 NOTE — [facility] Group Notes (Signed)
[facility]   04/24/2016 12:28 PM  Participation Quality:  Patient attended and participated in group discussion sharing her SMART goal was to "go home". Patient received a daily workbook on Leisure Activities.   Mood/Affect:  Appropriate  Depression Rating:  3  Anxiety Rating:  2  Thoughts of Suicide:  No Will you contract for safety?   Yes  Current AVH:  No  Plan for Discharge/Comments:  Discharge home with outpatient follow up  Transportation Means: car is in parking lot  Supports: patient reports she has no support from family  Lulu RidingIngle, Rogan Ecklund T, MSW, LCSW

## 2016-04-24 NOTE — Progress Notes (Signed)
Pt Alert and oriented this shift. Denies SI, HI, AVH. Reports needing to get home to take care of bills. Pt logical, calm and cooperative. Interacting with staff and peers appropriately. Encouragement and support offered. Pt receptive and remains safe on unit with q 15 min checks.

## 2016-04-24 NOTE — Progress Notes (Signed)
Patient to discharge tomorrow car is at valet parking [facility] straight out from door. Doctors office nurse tech dropped off car and key.

## 2016-04-24 NOTE — [facility] Group Notes (Signed)
[facility] LCSW Group Therapy  04/24/2016 2:21 PM  Type of Therapy:  Group Therapy  Participation Level:  Active  Participation Quality:  Attentive, Monopolizing and Redirectable  Affect:  Labile  Cognitive:  Alert and Disorganized  Insight:  Improving  Engagement in Therapy:  Improving  Modes of Intervention:  Discussion, Socialization and Support  Summary of Progress/Problems: Patient attended and participated in group discussion introducing herself and sharing during an introductory exercise 3 things she would take with her if she were stranded [place] would be "a Biomedical engineerbeach umbrella, a towel, and plenty of sunscreen". Patient was slightly manic but much improved from yesterday during group. Patient shared that she is familiar with mania and has helped folks that struggled with mania but denies she has symptoms. Patient is excited to be discharging tomorrow and has a plan in place for follow up.   Lulu RidingIngle, Jerrianne Hartin T, MSW, LCSW 04/24/2016, 2:21 PM
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
## 2016-04-24 NOTE — [facility] Group Notes (Signed)
[facility] LCSW Group Therapy  04/24/2016 11:49 AM  Type of Therapy:  Group Therapy  Participation Level:  Active  Participation Quality:  Attentive, Intrusive and Redirectable  Affect:  Irritable and Labile  Cognitive:  Disorganized  Insight:  Lacking  Engagement in Therapy:  Developing/Improving  Modes of Intervention:  Limit-setting, Socialization and Support  Summary of Progress/Problems: Patient attended and participated in group discussion introducing herself and participating in an introductory exercise sharing that her self-care activity is "using food as a medicine and I should be a vegetarian with my blood type". Patient was disorganized and intrusive but redirectable and maybe a little less intrusive than in previous groups.    Lulu RidingIngle, Sachi Boulay T, MSW, LCSW 04/24/2016, 11:49 AM

## 2016-04-25 LAB — GLUCOSE, CAPILLARY
Glucose-Capillary: 153 mg/dL — ABNORMAL HIGH (ref 65–99)
Glucose-Capillary: 133 mg/dL — ABNORMAL HIGH (ref 65–99)

## 2016-04-25 MED ORDER — TEMAZEPAM 15 MG PO CAPS: 15.0000 mg | ORAL_CAPSULE | Freq: Every day | ORAL | Status: DC

## 2016-04-25 MED ORDER — PERPHENAZINE 4 MG PO TABS: 4.0000 mg | ORAL_TABLET | Freq: Two times a day (BID) | ORAL | Status: DC

## 2016-04-25 NOTE — Progress Notes (Signed)
  [facility] :  Will you be returning to the same living situation after discharge:  Yes,  [place] [facility] [place] [facility] [place] [facility] [place]. At discharge, do you have transportation home?: Yes,  pt will provide her own transportation at discharge by driving her car which [facility] Do you have the ability to pay for your medications: Yes,  pt will be provided with prescriptions at discharge  Release of information consent forms completed and in the chart;  Patient's signature needed at discharge.  Patient to Follow up at: Follow-up Information    Please follow up.   Contact information:               [facility].   Why:  Please arrive to the walk-in clinic between the hours of 8am-2:30pm for your hospital follow up and assessment for medication managment and therapy.  Arrive as early as possible for prompt service.    Contact information:   [address] [place] [address] Phone: [phone] Fax: [phone]      Next level of care provider has access to [facility] Link:no  Safety Planning and Suicide Prevention discussed: No, pt refused SPE from the CSW  Have you used any form of tobacco in the last 30 days? (Cigarettes, Smokeless Tobacco, Cigars, and/or Pipes): Yes  Has patient been referred to the Quitline?: Patient refused referral  Patient has been referred for addiction treatment: N/A  Dorothe PeaJonathan F Collin Rengel 04/25/2016, 10:35 AM

## 2016-04-25 NOTE — [facility] Group Notes (Signed)
[facility] LCSW Group Therapy  04/25/2016 2:03 PM  Type of Therapy:  Group Therapy  Participation Level:  Did Not Attend  Summary of Progress/Problems: Patient was called to group but did not attend.   Tammra Pressman T, MSW, LCSW 04/25/2016, 2:03 PM 

## 2016-04-25 NOTE — Progress Notes (Signed)
Recreation Therapy Notes  Date: 05.05.17 Time: 9:30 am Location: Craft Room  Group Topic: Coping Skills  Goal Area(s) Addresses:  Patient will participate in healthy coping skill.  Behavioral Response: Attentive, Left early  Intervention: Coloring  Activity: Patients were instructed to color coloring sheets and think about what emotions they were experiencing as well as what they were thinking about while they were coloring.  Education:LRT educated patients on healthy coping skills.  Education Outcome: In group clarification offered  Clinical Observations/Feedback: Patient colored coloring sheet. Patient left group at approximately 10:02 am. Patient did not return to group.  Jacquelynn CreeGreene,Alanea Woolridge M, LRT/CTRS 04/25/2016 10:26 AM

## 2016-04-25 NOTE — Tx Team (Signed)
Interdisciplinary Treatment Plan Update (Adult)  Date:  04/25/2016 Time Reviewed:  10:16 AM  Progress in Treatment: Attending groups: Yes. Participating in groups:  Yes. Taking medication as prescribed:  Yes. Tolerating medication:  Yes. Family/Significant othe contact made:  No, will contact:  if patient provides consent Patient understands diagnosis:  No. and As evidenced by:  patient is not able to talk about reason she need hospitalization but is very disorganized.  Discussing patient identified problems/goals with staff:  Yes. Medical problems stabilized or resolved:  Yes. Denies suicidal/homicidal ideation: Yes. Issues/concerns per patient self-inventory:  Yes. Other:  New problem(s) identified: No, Describe:  none reported  Discharge Plan or Barriers: Pt will discharge to her home in [place] and follow up with [facility] for medication management and therapy   Reason for Continuation of Hospitalization: Mania Medication stabilization Other; describe parania  Comments:  Estimated length of stay: up to 4 days, expected discharge Monday 04/21/16  New goal(s):  Review of initial/current patient goals per problem list:  1. Goal(s): Participate in aftercare plan   Met: Yes  Target date: by discharge  As evidenced by: patient will participate in aftercare plan AEB aftercare provider and housing plan identified at discharge 04/17/16: Patient can discharge home once stabilized but needs outpatient follow up in [facility]. 04/22/16: Goal progressing. 04/24/16: Goal progressing. 04/25/16: Pt will discharge to her home in [place] and follow up with [facility] for medication management and therapy  2. Goal (s): Decrease mania   Met: Adequate for discharge per MD.    Target date: by discharge  As evidenced by: patient demonstrates decreased symptoms of mania 04/17/16: Patient is still manic and refusing meds and my have to  take forced meds.  04/22/16: Goal progressing. 04/24/16: Goal progressing. 04/25/16: Adequate for discharge per MD.  3. Goal (s): Decrease psychosis     Met: No  Target date: by discharge  As evidenced by: patient demonstrates decreased symptoms of psychosis 04/17/16: Patient is paranoid and disorganized in her conversations.  04/22/16: Goal progressing. 04/24/16: Goal progressing. 04/25/16: Adequate for discharge per MD.  The patient denies AVH.      Attendees: Attendees:  Patient:  Family:  Physician: Dr. Bary Leriche, MD    04/25/2016 9:30 AM  Nursing: , RN                 04/25/2016 9:30 AM  Clinical Social Worker: Marylou Flesher, [place]  04/25/2016 9:30 AM  Other:                04/25/2016 9:30 AM  Nursing: Lucile Shutters, RN               04/25/2016 9:30 AM  Nursing: Mechele Collin    04/25/2016 9:30 AM  Nursing: Elige Radon     04/25/2016 9:30 AM                                                      Scribe for Treatment Team:   Claudine Mouton, MSW, LCSW  04/25/2016, 10:16 AM   Alphonse Guild. Fotini Lemus, LCSWA, LCAS  04/25/16

## 2016-04-25 NOTE — [facility] Suicide Risk Assessment (Signed)
[facility] INPATIENT:  Family/Significant Other Suicide Prevention Education  Suicide Prevention Education:  Patient Refusal for Family/Significant Other Suicide Prevention Education: The patient Deanna [name] has refused to provide written consent for family/significant other to be provided Family/Significant Other Suicide Prevention Education during admission and/or prior to discharge.  Physician notified.  Pt refused SPE from the CSW.  Dorothe PeaJonathan F Treyveon Mochizuki 04/25/2016, 10:43 AM

## 2016-04-25 NOTE — [facility] Group Notes (Signed)
[facility] Group Notes:  (Nursing/MHT/Case Management/Adjunct)  Date:  04/25/2016  Time:  12:04 AM  Type of Therapy:  Psychoeducational Skills  Participation Level:  Active  Participation Quality:  Appropriate, Attentive and Sharing  Affect:  Appropriate  Cognitive:  Oriented  Insight:  Good  Engagement in Group:  Engaged  Modes of Intervention:  Discussion and Exploration  Summary of Progress/Problems:  Deanna [name] R Dovber Ernest 04/25/2016, 12:04 AM

## 2016-04-25 NOTE — Plan of Care (Signed)
Problem: Alteration in mood Goal: LTG-Patient reports reduction in suicidal thoughts (Patient reports reduction in suicidal thoughts and is able to verbalize a safety plan for whenever patient is feeling suicidal)  Outcome: Progressing Patient denies SI.      

## 2016-04-25 NOTE — [facility] Suicide Risk Assessment (Deleted)
[facility] INPATIENT:  Family/Significant Other Suicide Prevention Education  Suicide Prevention Education:  Patient Refusal for Family/Significant Other Suicide Prevention Education: The patient Deanna [name] has refused to provide written consent for family/significant other to be provided Family/Significant Other Suicide Prevention Education during admission and/or prior to discharge.  Physician notified.  CSW completed SPE with the pt.    [name] 04/25/2016, 10:16 AM

## 2016-04-25 NOTE — Progress Notes (Addendum)
Patient looks tense.  Easily irritated.  Rates depression as a 3  Denies SI/HI/AVH.   Discharge instructions given, verbalized understanding.  Prescriptions given.  Belongings returned.  As was going through her belongings stated " I have a ring missing it is a white gold, marquis cut amethyst." Called [employer] & CompanyChelesa Crump, MHT that checked her belongings in.  Chelesa stated that there were 3 rings in the container.  She did not break the seal she just noted what was in the container.   Called ED B[facility], talked with Toniann FailWendy checked to she if they had anything in the locker and she verbalized that they did not.  Informed patient of what MHT said. Patient states that she would leave and that she knew we would follow the chain of command.  Escorted off unit by this Clinical [facility]writer [facility] with courtesy car to be taken to her car.

## 2016-05-06 DIAGNOSIS — R05 Cough: Secondary | ICD-10-CM | POA: Diagnosis not present

## 2016-05-06 DIAGNOSIS — R0609 Other forms of dyspnea: Secondary | ICD-10-CM | POA: Diagnosis not present

## 2016-05-06 DIAGNOSIS — J449 Chronic obstructive pulmonary disease, unspecified: Secondary | ICD-10-CM | POA: Diagnosis not present

## 2016-11-02 ENCOUNTER — Emergency Department
Admission: EM | Admit: 2016-11-02 | Discharge: 2016-11-04 | Disposition: A | Discharge status: Other Acute Inpatient Hospital | Payer: 59 | Attending: Emergency Medicine | Admitting: Emergency Medicine

## 2016-11-02 DIAGNOSIS — F17210 Nicotine dependence, cigarettes, uncomplicated: Secondary | ICD-10-CM | POA: Diagnosis not present

## 2016-11-02 DIAGNOSIS — F312 Bipolar disorder, current episode manic severe with psychotic features: Secondary | ICD-10-CM | POA: Diagnosis not present

## 2016-11-02 DIAGNOSIS — Z79899 Other long term (current) drug therapy: Secondary | ICD-10-CM | POA: Diagnosis not present

## 2016-11-02 DIAGNOSIS — I10 Essential (primary) hypertension: Secondary | ICD-10-CM | POA: Diagnosis not present

## 2016-11-02 DIAGNOSIS — I2510 Atherosclerotic heart disease of native coronary artery without angina pectoris: Secondary | ICD-10-CM | POA: Insufficient documentation

## 2016-11-02 DIAGNOSIS — J449 Chronic obstructive pulmonary disease, unspecified: Secondary | ICD-10-CM | POA: Diagnosis not present

## 2016-11-02 DIAGNOSIS — E119 Type 2 diabetes mellitus without complications: Secondary | ICD-10-CM

## 2016-11-02 DIAGNOSIS — R456 Violent behavior: Secondary | ICD-10-CM | POA: Diagnosis present

## 2016-11-02 NOTE — ED Notes (Signed)
Pt dressed out into hospital scrubs. Pt belongings (pair of black shoes, 2 pair of socks yellow/black, underwear, bra, black shirt, black pants, teal/pink/purple skirt, black/green colored apron, rubber band and silver earrings) all placed into belongings bag. Pt escorted to room 19H and belongings beside quad nurse desk.
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
## 2016-11-03 DIAGNOSIS — F312 Bipolar disorder, current episode manic severe with psychotic features: Secondary | ICD-10-CM

## 2016-11-03 LAB — CBC WITH DIFFERENTIAL/PLATELET
Basophils Absolute: 0.1 10*3/uL (ref 0–0.1)
Basophils Relative: 1 %
Eosinophils Absolute: 0.5 10*3/uL (ref 0–0.7)
Eosinophils Relative: 7 %
HCT: 38.3 % (ref 35.0–47.0)
Hemoglobin: 12.6 g/dL (ref 12.0–16.0)
Lymphocytes Relative: 35 %
Lymphs Abs: 2.4 10*3/uL (ref 1.0–3.6)
MCH: 32 pg (ref 26.0–34.0)
MCHC: 33 g/dL (ref 32.0–36.0)
MCV: 97.1 fL (ref 80.0–100.0)
Monocytes Absolute: 0.5 10*3/uL (ref 0.2–0.9)
Monocytes Relative: 8 %
Neutro Abs: 3.4 10*3/uL (ref 1.4–6.5)
Neutrophils Relative %: 49 %
Platelets: 224 10*3/uL (ref 150–440)
RBC: 3.94 MIL/uL (ref 3.80–5.20)
RDW: 14.3 % (ref 11.5–14.5)
WBC: 6.9 10*3/uL (ref 3.6–11.0)

## 2016-11-03 LAB — COMPREHENSIVE METABOLIC PANEL
ALT: 17 U/L (ref 14–54)
AST: 16 U/L (ref 15–41)
Albumin: 4.1 g/dL (ref 3.5–5.0)
Alkaline Phosphatase: 68 U/L (ref 38–126)
Anion gap: 6 (ref 5–15)
BUN: 35 mg/dL — ABNORMAL HIGH (ref 6–20)
CO2: 25 mmol/L (ref 22–32)
Calcium: 9.7 mg/dL (ref 8.9–10.3)
Chloride: 108 mmol/L (ref 101–111)
Creatinine, Ser: 1.41 mg/dL — ABNORMAL HIGH (ref 0.44–1.00)
GFR calc Af Amer: 45 mL/min — ABNORMAL LOW (ref >60)
GFR calc non Af Amer: 39 mL/min — ABNORMAL LOW (ref >60)
Glucose, Bld: 125 mg/dL — ABNORMAL HIGH (ref 65–99)
Potassium: 4.1 mmol/L (ref 3.5–5.1)
Sodium: 139 mmol/L (ref 135–145)
Total Bilirubin: 0.4 mg/dL (ref 0.3–1.2)
Total Protein: 7.1 g/dL (ref 6.5–8.1)

## 2016-11-03 LAB — URINALYSIS COMPLETE WITH MICROSCOPIC (ARMC ONLY)
Bilirubin Urine: NEGATIVE
Glucose, UA: NEGATIVE mg/dL
Hgb urine dipstick: NEGATIVE
Ketones, ur: NEGATIVE mg/dL
Nitrite: NEGATIVE
Protein, ur: NEGATIVE mg/dL
Specific Gravity, Urine: 1.01 (ref 1.005–1.030)
pH: 5 (ref 5.0–8.0)

## 2016-11-03 LAB — LIPASE, BLOOD: Lipase: 30 U/L (ref 11–51)

## 2016-11-03 LAB — GLUCOSE, CAPILLARY
Glucose-Capillary: 143 mg/dL — ABNORMAL HIGH (ref 65–99)
Glucose-Capillary: 110 mg/dL — ABNORMAL HIGH (ref 65–99)

## 2016-11-03 LAB — TROPONIN I: Troponin I: <0.03 ng/mL (ref <0.03)

## 2016-11-03 LAB — ACETAMINOPHEN LEVEL: Acetaminophen (Tylenol), Serum: <10 ug/mL — ABNORMAL LOW (ref 10–30)

## 2016-11-03 LAB — SALICYLATE LEVEL: Salicylate Lvl: <7 mg/dL (ref 2.8–30.0)

## 2016-11-03 MED ORDER — ENALAPRIL MALEATE 2.5 MG PO TABS
2.5000 mg | ORAL_TABLET | Freq: Every day | ORAL | Status: DC
  Administered 2016-11-03: 2.5 mg via ORAL
  Filled 2016-11-03 (×2): qty 1

## 2016-11-03 MED ORDER — ZIPRASIDONE MESYLATE 20 MG IM SOLR
20.0000 mg | Freq: Once | INTRAMUSCULAR | Status: AC
  Administered 2016-11-03: 20 mg via INTRAMUSCULAR

## 2016-11-03 MED ORDER — ZIPRASIDONE MESYLATE 20 MG IM SOLR
INTRAMUSCULAR | Status: AC
  Administered 2016-11-03: 20 mg via INTRAMUSCULAR
  Filled 2016-11-03: qty 20

## 2016-11-03 MED ORDER — PERPHENAZINE 4 MG PO TABS
4.0000 mg | ORAL_TABLET | Freq: Two times a day (BID) | ORAL | Status: DC
  Administered 2016-11-03 (×2): 4 mg via ORAL
  Filled 2016-11-03 (×5): qty 1

## 2016-11-03 MED ORDER — CILOSTAZOL 100 MG PO TABS
50.0000 mg | ORAL_TABLET | Freq: Two times a day (BID) | ORAL | Status: DC
  Administered 2016-11-03: 50 mg via ORAL
  Filled 2016-11-03: qty 0.5
  Filled 2016-11-03 (×2): qty 1
  Filled 2016-11-03: qty 0.5

## 2016-11-03 MED ORDER — LORAZEPAM 1 MG PO TABS
1.0000 mg | ORAL_TABLET | Freq: Once | ORAL | Status: AC
  Administered 2016-11-03: 1 mg via ORAL

## 2016-11-03 MED ORDER — ROSUVASTATIN CALCIUM 5 MG PO TABS
5.0000 mg | ORAL_TABLET | Freq: Every day | ORAL | Status: DC
  Administered 2016-11-03: 5 mg via ORAL
  Filled 2016-11-03 (×2): qty 1

## 2016-11-03 MED ORDER — DILTIAZEM HCL ER COATED BEADS 180 MG PO CP24
180.0000 mg | ORAL_CAPSULE | Freq: Every day | ORAL | Status: DC
  Administered 2016-11-03: 180 mg via ORAL
  Filled 2016-11-03 (×2): qty 1

## 2016-11-03 MED ORDER — LORAZEPAM 1 MG PO TABS
ORAL_TABLET | ORAL | Status: AC
  Administered 2016-11-03: 1 mg via ORAL
  Filled 2016-11-03: qty 1

## 2016-11-03 MED ORDER — CLOPIDOGREL BISULFATE 75 MG PO TABS
75.0000 mg | ORAL_TABLET | Freq: Every day | ORAL | Status: DC
  Filled 2016-11-03 (×2): qty 1

## 2016-11-03 MED ORDER — INSULIN ASPART 100 UNIT/ML ~~LOC~~ SOLN
0.0000 [IU] | Freq: Three times a day (TID) | SUBCUTANEOUS | Status: DC
Start: 2016-11-03 — End: 2016-11-04

## 2016-11-03 NOTE — ED Notes (Signed)
Patient is alert and oriented, states I really thought I was going to other floor, nurse let her know that she would as soon as bed was available, Patient is calm, speaks in normal tone, states she wants to watch tv, patient denies Si/hi or avh at this time. q 15 min. Checks and camera surveillance in progress.

## 2016-11-03 NOTE — ED Notes (Signed)
Pt insisting on seeing the doctor again, slammed door. Instructed not to slam door, pt began talking non stop about how we are "ruining her life", we are making her "homeless", and that she "pays taxes for our salary".
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
## 2016-11-03 NOTE — ED Notes (Signed)
Pt offered medication to calm down, accepted medication without difficulty.

## 2016-11-03 NOTE — ED Notes (Signed)
Pt pacing around room saying this is "cruel and unusual punishment" and that she needs to get out of here because she needs "life sustaining air and a cigarette".
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
## 2016-11-03 NOTE — ED Notes (Signed)
Patient noted in room. No complaints, stable, in no acute distress. Q15 minute rounds and monitoring via Security Cameras to continue.  
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
## 2016-11-03 NOTE — ED Notes (Signed)
Pt screaming at NT and police in hallway, ushered into room and reoriented to procedure, pt manic speech, targeting mostly authority figures, tv turned on, pt eventually calmed
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
## 2016-11-03 NOTE — ED Notes (Signed)
Pharm tech reports calling pts pharmacy, no prescription for antipsychotics noted.
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
## 2016-11-03 NOTE — ED Notes (Signed)
Pt yelling at staff, door closed with this RN talking to pt ODS standby, pt upset about being woken, then being "incarcerated", then at missing her appointments, transitioning into anger about her living situation ([place]). Pt wants to leave, "sign myself out" reoriented to procedure of who can rescind IVC, pt eventually calmed, now talking to TTS

## 2016-11-03 NOTE — ED Notes (Signed)
Pt  ivc  Pending  Placement

## 2016-11-03 NOTE — ED Notes (Signed)
Patient calm and cooperative.  Patient took PO medications without incident.

## 2016-11-04 ENCOUNTER — Inpatient Hospital Stay
Admission: EM | Admit: 2016-11-04 | Discharge: 2016-11-10 | DRG: 885 | Disposition: A | Discharge status: Home / Self Care | Payer: 59 | Source: Intra-hospital | Attending: Psychiatry | Admitting: Psychiatry

## 2016-11-04 ENCOUNTER — Encounter: Payer: Self-pay | Admitting: Psychiatry

## 2016-11-04 DIAGNOSIS — F312 Bipolar disorder, current episode manic severe with psychotic features: Secondary | ICD-10-CM | POA: Diagnosis present

## 2016-11-04 DIAGNOSIS — F17210 Nicotine dependence, cigarettes, uncomplicated: Secondary | ICD-10-CM | POA: Diagnosis present

## 2016-11-04 DIAGNOSIS — F17200 Nicotine dependence, unspecified, uncomplicated: Secondary | ICD-10-CM | POA: Diagnosis present

## 2016-11-04 DIAGNOSIS — Z888 Allergy status to other drugs, medicaments and biological substances status: Secondary | ICD-10-CM | POA: Diagnosis not present

## 2016-11-04 DIAGNOSIS — J449 Chronic obstructive pulmonary disease, unspecified: Secondary | ICD-10-CM | POA: Diagnosis present

## 2016-11-04 DIAGNOSIS — G4700 Insomnia, unspecified: Secondary | ICD-10-CM | POA: Diagnosis present

## 2016-11-04 DIAGNOSIS — I10 Essential (primary) hypertension: Secondary | ICD-10-CM | POA: Diagnosis present

## 2016-11-04 DIAGNOSIS — K219 Gastro-esophageal reflux disease without esophagitis: Secondary | ICD-10-CM | POA: Diagnosis present

## 2016-11-04 DIAGNOSIS — R109 Unspecified abdominal pain: Secondary | ICD-10-CM | POA: Diagnosis not present

## 2016-11-04 DIAGNOSIS — Z23 Encounter for immunization: Secondary | ICD-10-CM

## 2016-11-04 DIAGNOSIS — E119 Type 2 diabetes mellitus without complications: Secondary | ICD-10-CM

## 2016-11-04 DIAGNOSIS — Z882 Allergy status to sulfonamides status: Secondary | ICD-10-CM

## 2016-11-04 DIAGNOSIS — I2510 Atherosclerotic heart disease of native coronary artery without angina pectoris: Secondary | ICD-10-CM | POA: Diagnosis present

## 2016-11-04 DIAGNOSIS — Z79899 Other long term (current) drug therapy: Secondary | ICD-10-CM

## 2016-11-04 DIAGNOSIS — Z9119 Patient's noncompliance with other medical treatment and regimen: Secondary | ICD-10-CM

## 2016-11-04 DIAGNOSIS — Z7902 Long term (current) use of antithrombotics/antiplatelets: Secondary | ICD-10-CM

## 2016-11-04 LAB — LIPID PANEL
Cholesterol: 229 mg/dL — ABNORMAL HIGH (ref 0–200)
HDL: 57 mg/dL (ref >40)
LDL Cholesterol: 157 mg/dL — ABNORMAL HIGH (ref 0–99)
Total CHOL/HDL Ratio: 4 RATIO
Triglycerides: 75 mg/dL (ref <150)
VLDL: 15 mg/dL (ref 0–40)

## 2016-11-04 LAB — URINE CULTURE: Special Requests: NORMAL

## 2016-11-04 LAB — HEMOGLOBIN A1C
Hgb A1c MFr Bld: 5.7 % — ABNORMAL HIGH (ref 4.8–5.6)
Mean Plasma Glucose: 117 mg/dL

## 2016-11-04 LAB — TSH: TSH: 1.465 u[IU]/mL (ref 0.350–4.500)

## 2016-11-04 LAB — GLUCOSE, CAPILLARY
Glucose-Capillary: 113 mg/dL — ABNORMAL HIGH (ref 65–99)
Glucose-Capillary: 111 mg/dL — ABNORMAL HIGH (ref 65–99)
Glucose-Capillary: 123 mg/dL — ABNORMAL HIGH (ref 65–99)

## 2016-11-04 MED ORDER — ALUM & MAG HYDROXIDE-SIMETH 200-200-20 MG/5ML PO SUSP: 30.0000 mL | ORAL | Status: DC | PRN

## 2016-11-04 MED ORDER — DILTIAZEM HCL ER COATED BEADS 180 MG PO CP24
180.0000 mg | ORAL_CAPSULE | Freq: Every day | ORAL | Status: DC
  Administered 2016-11-04 – 2016-11-10 (×6): 180 mg via ORAL
  Filled 2016-11-04 (×10): qty 1

## 2016-11-04 MED ORDER — MAGNESIUM HYDROXIDE 400 MG/5ML PO SUSP: 30.0000 mL | Freq: Every day | ORAL | Status: DC | PRN

## 2016-11-04 MED ORDER — ENALAPRIL MALEATE 2.5 MG PO TABS
2.5000 mg | ORAL_TABLET | Freq: Every day | ORAL | Status: DC
  Administered 2016-11-05 – 2016-11-10 (×5): 2.5 mg via ORAL
  Filled 2016-11-04 (×10): qty 1

## 2016-11-04 MED ORDER — CILOSTAZOL 100 MG PO TABS
50.0000 mg | ORAL_TABLET | Freq: Two times a day (BID) | ORAL | Status: DC
  Administered 2016-11-04 – 2016-11-10 (×10): 50 mg via ORAL
  Filled 2016-11-04 (×2): qty 1
  Filled 2016-11-04 (×3): qty 0.5
  Filled 2016-11-04 (×3): qty 1
  Filled 2016-11-04: qty 0.5
  Filled 2016-11-04 (×7): qty 1

## 2016-11-04 MED ORDER — BACITRACIN ZINC 500 UNIT/GM EX OINT
TOPICAL_OINTMENT | CUTANEOUS | Status: DC | PRN
  Administered 2016-11-04: 22:00:00 via TOPICAL
  Administered 2016-11-05: 1 via TOPICAL
  Administered 2016-11-05: 05:00:00 via TOPICAL
  Administered 2016-11-08: 1 via TOPICAL
  Filled 2016-11-04 (×2): qty 14.17
  Filled 2016-11-04: qty 28.35

## 2016-11-04 MED ORDER — ROSUVASTATIN CALCIUM 5 MG PO TABS
5.0000 mg | ORAL_TABLET | Freq: Every day | ORAL | Status: DC
  Administered 2016-11-04 – 2016-11-10 (×7): 5 mg via ORAL
  Filled 2016-11-04 (×10): qty 1

## 2016-11-04 MED ORDER — CLOPIDOGREL BISULFATE 75 MG PO TABS
75.0000 mg | ORAL_TABLET | Freq: Every day | ORAL | Status: DC
  Administered 2016-11-04 – 2016-11-08 (×5): 75 mg via ORAL
  Filled 2016-11-04 (×6): qty 1

## 2016-11-04 MED ORDER — ACETAMINOPHEN 325 MG PO TABS: 650.0000 mg | ORAL_TABLET | Freq: Four times a day (QID) | ORAL | Status: DC | PRN

## 2016-11-04 MED ORDER — TEMAZEPAM 15 MG PO CAPS
15.0000 mg | ORAL_CAPSULE | Freq: Every day | ORAL | Status: DC
  Administered 2016-11-04: 15 mg via ORAL
  Filled 2016-11-04: qty 1

## 2016-11-04 MED ORDER — INSULIN ASPART 100 UNIT/ML ~~LOC~~ SOLN
0.0000 [IU] | Freq: Three times a day (TID) | SUBCUTANEOUS | Status: DC
  Filled 2016-11-04 (×2): qty 1

## 2016-11-04 MED ORDER — PERPHENAZINE 4 MG PO TABS
4.0000 mg | ORAL_TABLET | Freq: Two times a day (BID) | ORAL | Status: DC
Start: 2016-11-04 — End: 2016-11-10
  Administered 2016-11-04 – 2016-11-10 (×13): 4 mg via ORAL
  Filled 2016-11-04 (×14): qty 1

## 2016-11-04 NOTE — Plan of Care (Signed)
Problem: Spiritual Needs Goal: Ability to function at adequate level Outcome: Progressing Spiritual consult requested: Patient willing to speak to a priest

## 2016-11-04 NOTE — Plan of Care (Signed)
Problem: Role Relationship: Goal: Ability to communicate needs accurately will improve Outcome: Progressing Patient able to express her needs and feelings

## 2016-11-04 NOTE — BHH Group Notes (Signed)
BHH Group Notes:  (Nursing/MHT/Case Management/Adjunct)  Date:  11/04/2016  Time:  5:19 AM  Type of Therapy:  Psychoeducational Skills  Participation Level:  Did Not Attend  Participation Quality:  late admit  Affect: Summary of Progress/Problems:  Deanna NeerJackie L Grayson [name] 11/04/2016, 5:19 AM

## 2016-11-04 NOTE — Plan of Care (Signed)
Problem: Activity: Goal: Will verbalize the importance of balancing activity with adequate rest periods Outcome: Not Progressing Patient is newly admitted, anxious and restless

## 2016-11-04 NOTE — ED Notes (Signed)
Patient noted in room. No complaints, stable, in no acute distress. Q15 minute rounds and monitoring via Security Cameras to continue.  

## 2016-11-04 NOTE — Tx Team (Signed)
Initial Treatment Plan 11/04/2016 5:28 AM Henreitta Ceaeborah K Beal MWN:[id number]    PATIENT STRESSORS: Financial difficulties Health problems Loss of housing   PATIENT STRENGTHS: Active sense of humor Communication skills General fund of knowledge   PATIENT IDENTIFIED PROBLEMS: "I am worried.. I  am being evicted"  Depression  Anxiety  Hopelessness               DISCHARGE CRITERIA:  Ability to meet basic life and health needs Adequate post-discharge living arrangements Improved stabilization in mood, thinking, and/or behavior Verbal commitment to aftercare and medication compliance  PRELIMINARY DISCHARGE PLAN: Attend aftercare/continuing care group Attend PHP/IOP Placement in alternative living arrangements  PATIENT/FAMILY INVOLVEMENT: This treatment plan has been presented to and reviewed with the patient, Sharion DoveDeborah K Shearn,The patient and family have been given the opportunity to ask questions and make suggestions.  Olin PiaByungura, Karin Pinedo, RN 11/04/2016, 5:28 AM

## 2016-11-04 NOTE — Plan of Care (Signed)
Problem: Coping: Goal: Ability to cope will improve Outcome: Progressing Patient placed near nursing station. Safety precautions initiated
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
## 2016-11-04 NOTE — [facility] Suicide Risk Assessment (Signed)
[facility]   Nursing information obtained from:    Demographic factors:    Current Mental Status:    Loss Factors:    Historical Factors:    Risk Reduction Factors:     Total Time spent with patient: 1 hour Principal Problem: Bipolar I disorder, most recent episode manic, severe with psychotic features [facility](HCC) Diagnosis:   Patient Active Problem List   Diagnosis Date Noted  . Bipolar I disorder, most recent episode manic, severe with psychotic features (HCC) [F31.2] 04/16/2016  . Tobacco use disorder [F17.200] 04/16/2016  . Diabetes mellitus without complication (HCC) [E11.9] 04/15/2016  . Kidney disease [N28.9] 04/15/2016  . COPD (chronic obstructive pulmonary disease) (HCC) [J44.9] 04/15/2016  . Hypertension [I10] 04/15/2016  . Coronary artery disease [I25.10] 04/15/2016   Subjective Data: Psychotic break.  Continued Clinical Symptoms:    The "Alcohol Use Disorders Identification Test", Guidelines for Use in Primary Care, Second Edition.  World Science writerHealth Organization [facility](WHO). Score between 0-7:  no or low risk or alcohol related problems. Score between 8-15:  moderate risk of alcohol related problems. Score between 16-19:  high risk of alcohol related problems. Score 20 or above:  warrants further diagnostic evaluation for alcohol dependence and treatment.   CLINICAL FACTORS:   Bipolar Disorder:   Mixed State Currently Psychotic   Musculoskeletal: Strength & Muscle Tone: within normal limits Gait & Station: normal Patient leans: N/A  Psychiatric Specialty Exam: Physical Exam  Nursing note and vitals reviewed.   Review of Systems  Psychiatric/Behavioral: Positive for hallucinations. The patient has insomnia.   All other systems reviewed and are negative.   Blood pressure (!) 94/51, pulse 78, temperature 98.2 F (36.8 C), temperature source Oral, resp. rate 16, height 5\' 4"  (1.626 m), weight 49.9 kg (110 lb), SpO2 98 %.Body mass index is 18.88  kg/m.  General Appearance: Casual  Eye Contact:  Good  Speech:  Clear and Coherent  Volume:  Increased  Mood:  Angry, Dysphoric and Irritable  Affect:  Congruent  Thought Process:  Disorganized and Descriptions of Associations: Tangential  Orientation:  Full (Time, Place, and Person)  Thought Content:  Delusions, Hallucinations: Auditory and Paranoid Ideation  Suicidal Thoughts:  No  Homicidal Thoughts:  No  Memory:  Immediate;   Poor Recent;   Poor Remote;   Poor  Judgement:  Poor  Insight:  Lacking  Psychomotor Activity:  Increased  Concentration:  Concentration: Poor and Attention Span: Poor  Recall:  Poor  Fund of Knowledge:  Fair  Language:  Fair  Akathisia:  No  Handed:  Right  AIMS (if indicated):     Assets:  Communication Skills Desire for Improvement Financial Resources/Insurance Physical Health Resilience  ADL's:  Intact  Cognition:  WNL  Sleep:  Number of Hours: 0      COGNITIVE FEATURES THAT CONTRIBUTE TO RISK:  None    SUICIDE RISK:   Moderate:  Frequent suicidal ideation with limited intensity, and duration, some specificity in terms of plans, no associated intent, good self-control, limited dysphoria/symptomatology, some risk factors present, and identifiable protective factors, including available and accessible social support.   PLAN OF CARE: Hospital admission, medication management, discharge planning.  Ms. Deanna [name] is a 65 year old female with a history of bipolar disorder admitted in a manic psychotic episode in the context of treatment noncompliance.  1. Psychosis. She was restarted on perphenazine. She refused a mood stabilizer.   2. Insomnia. Restoril is available.  3. Smoking. Nicotine patch was available.  4. Hypertension. She is on Cardizem and enalapril.  5. Coronary artery disease. She is on Plavix, Crestor, and Pletal.  6. Diabetes. She is taking to Trulicity at home and refuses insulin in the hospital. She is on sliding  scale insulin, blood glucose monitoring, ADA diet.  7. COPD. She is on inhaler.  8. Metabolic syndrome monitoring. Labs completed during previous admission.   9. Disposition. She is being evicted. She will need a follow-up with local psychiatrist.    I certify that inpatient services furnished can reasonably be expected to improve the patient's condition.  Kristine LineaJolanta Lasha Echeverria, MD 11/04/2016, 10:16 AM

## 2016-11-04 NOTE — BHH Group Notes (Signed)
BHH Group Notes:  (Nursing/MHT/Case Management/Adjunct)  Date:  11/04/2016  Time:  9:50 PM  Type of Therapy:  Evening Wrap-up Group  Participation Level:  Minimal  Participation Quality:  Attentive  Affect:  Irritable  Cognitive:  Alert  Insight:  Limited  Engagement in Group:  Limited  Modes of Intervention:  Discussion  Summary of Progress/Problems:  Tomasita MorrowChelsea Nanta Ha Placeres 11/04/2016, 9:50 PM

## 2016-11-04 NOTE — BHH Group Notes (Signed)
BHH Group Notes:  (Nursing/MHT/Case Management/Adjunct)  Date:  11/04/2016  Time:  2:19 PM  Type of Therapy:  Psychoeducational Skills  Participation Level:  Active  Participation Quality:  Intrusive  Affect:  Blunted  Cognitive:  Disorganized  Insight:  Lacking  Engagement in Group:  Engaged  Modes of Intervention:  Discussion and Education  Summary of Progress/Problems:  Mickey Farberamela M Moses Ellison 11/04/2016, 2:19 PM

## 2016-11-04 NOTE — Plan of Care (Signed)
Problem: Coping: Goal: Ability to cope will improve Outcome: Not Progressing Focused on her home being evicted
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
## 2016-11-04 NOTE — BHH Group Notes (Signed)
[facility] Therapy [facility]  Date/Time: 11/04/2016 3:00pm  Type of Therapy/Topic:  Group Therapy:  Feelings about Diagnosis  Participation Level:  Active   Mood: Reports Good   Description of Group:    This group will allow patients to explore their thoughts and feelings about diagnoses they have received. Patients will be guided to explore their level of understanding and acceptance of these diagnoses. Facilitator will encourage patients to process their thoughts and feelings about the reactions of others to their diagnosis, and will guide patients in identifying ways to discuss their diagnosis with significant others in their lives. This group will be process-oriented, with patients participating in exploration of their own experiences as well as giving and receiving support and challenge from other group members.   Therapeutic Goals: 1. Patient will demonstrate understanding of diagnosis as evidence by identifying two or more symptoms of the disorder:  2. Patient will be able to express two feelings regarding the diagnosis 3. Patient will demonstrate ability to communicate their needs through discussion and/or role plays  Summary of Patient Progress:  Pt able to achieve therapeutic goals despite still being tangential and requiring some redirection.   Therapeutic Modalities:   Cognitive Behavioral Therapy Brief Therapy Feelings Identification   Jake SharkSara Kinsley Nicklaus,  MSW, LCSW

## 2016-11-04 NOTE — Progress Notes (Signed)
Patient stayed in room, unable to sleep after admission "this is my time to get up". Performed hygiene and stayed in room, reading. Continues to request to speak with a Child psychotherapistsocial worker regarding housing. Therapeutic milieu promoted. Emotional support and encouragements provided. Safety precautions maintained.
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
## 2016-11-05 LAB — GLUCOSE, CAPILLARY
Glucose-Capillary: 114 mg/dL — ABNORMAL HIGH (ref 65–99)
Glucose-Capillary: 169 mg/dL — ABNORMAL HIGH (ref 65–99)
Glucose-Capillary: 112 mg/dL — ABNORMAL HIGH (ref 65–99)

## 2016-11-05 LAB — PROLACTIN: Prolactin: 19.3 ng/mL (ref 4.8–23.3)

## 2016-11-05 LAB — HEMOGLOBIN A1C
Hgb A1c MFr Bld: 5.9 % — ABNORMAL HIGH (ref 4.8–5.6)
Mean Plasma Glucose: 123 mg/dL

## 2016-11-05 MED ORDER — FOSFOMYCIN TROMETHAMINE 3 G PO PACK
3.0000 g | PACK | Freq: Once | ORAL | Status: AC
  Administered 2016-11-05: 3 g via ORAL
  Filled 2016-11-05: qty 3

## 2016-11-05 MED ORDER — INFLUENZA VAC SPLIT QUAD 0.5 ML IM SUSY
0.5000 mL | PREFILLED_SYRINGE | INTRAMUSCULAR | Status: AC
  Administered 2016-11-07: 0.5 mL via INTRAMUSCULAR
  Filled 2016-11-05: qty 0.5

## 2016-11-05 MED ORDER — TEMAZEPAM 15 MG PO CAPS
30.0000 mg | ORAL_CAPSULE | Freq: Every day | ORAL | Status: DC
  Administered 2016-11-05 – 2016-11-08 (×2): 30 mg via ORAL
  Filled 2016-11-05 (×5): qty 2

## 2016-11-05 NOTE — BHH Group Notes (Signed)
BHH LCSW Group Therapy  11/05/2016 1:42 PM  Type of Therapy:  Group Therapy  Participation Level:  Active  Participation Quality:  Monopolizing and Sharing  Affect:  Anxious and Resistant  Cognitive:  Lacking  Insight:  None  Engagement in Therapy:  Engaged  Modes of Intervention:  Activity, Discussion, Education and Support  Summary of Progress/Problems:Emotional Regulation: Patients will identify both negative and positive emotions. They will discuss emotions they have difficulty regulating and how they impact their lives. Patients will be asked to identify healthy coping skills to combat unhealthy reactions to negative emotions. Patient needed constant redirection to stay on the group topic. CSW provided support to patient for sharing openly for the group but patient had difficulty allowing others to speak in the group.  Alexx Giambra G. Garnette CzechSampson MSW, LCSWA 11/05/2016, 1:45 PM
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
## 2016-11-05 NOTE — Progress Notes (Signed)
D: Patient stated slept good last night .Stated appetite is good  Stated concentration is good . Stated no Depression  Denies suicidal  homicidal ideations  .  No auditory hallucinations  No pain concerns . Appropriate ADL'S. Interacting with peers and staff. Voice concerns  around her apartment  And housing somewhere else .  Continual requesting to  Talk  To social worker  Area at knuckle  Noted healing.  A: Encourage patient participation with unit programming . Instruction  Given on  Medication , verbalize understanding. R: Voice no other concerns. Staff continue to monitor
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
## 2016-11-05 NOTE — Progress Notes (Signed)
[facility]  11/05/2016 12:19 PM Henreitta CeaDeborah K Saylor  MRN:  [id number]  Subjective:  [name] is much calmer and more coherent today. She is able to hold a brief conversation without getting upset, disorganized, paranoid. She accepts medications and tolerates them well. There are no somatic complaints. She slept 4 hours only last night with Restoril. She is relieved that her eviction is not imminent but wants to be discharged as soon as possible to take care of her affairs. Apparently she does not have access to her disability check as she was switching Banks. She was unable to pay her rent last month. This likely contributed to her eviction along with paranoid, psychotic behavior at the apartment complex.   Principal Problem: Bipolar I disorder, most recent episode manic, severe with psychotic features (HCC) Diagnosis:   Patient Active Problem List   Diagnosis Date Noted  . Bipolar I disorder, most recent episode manic, severe with psychotic features (HCC) [F31.2] 04/16/2016  . Tobacco use disorder [F17.200] 04/16/2016  . Diabetes mellitus without complication (HCC) [E11.9] 04/15/2016  . Kidney disease [N28.9] 04/15/2016  . COPD (chronic obstructive pulmonary disease) (HCC) [J44.9] 04/15/2016  . Hypertension [I10] 04/15/2016  . Coronary artery disease [I25.10] 04/15/2016   Total Time spent with patient: 20 minutes  Past Psychiatric History: Bipolar disorder.  Past Medical History:  Past Medical History:  Diagnosis Date  . COPD (chronic obstructive pulmonary disease) (HCC)   . Diabetes mellitus without complication (HCC)   . GERD (gastroesophageal reflux disease)   . Kidney disease    History reviewed. No pertinent surgical history. Family History: History reviewed. No pertinent family history. Family Psychiatric  History: See H&P. Social History:  History  Alcohol Use No     History  Drug Use No    Social History   Social History  . Marital status: Divorced     Spouse name: N/A  . Number of children: N/A  . Years of education: N/A   Social History Main Topics  . Smoking status: Current Every Day Smoker    Packs/day: 0.50    Years: 40.00    Types: Cigarettes  . Smokeless tobacco: Current User    Types: Snuff     Comment: not interested in quitting  . Alcohol use No  . Drug use: No  . Sexual activity: Not Currently    Birth control/ protection: None   Other Topics Concern  . None   Social History Narrative  . None   Additional Social History:                         Sleep: Poor  Appetite:  Fair  Current Medications: Current Facility-Administered Medications  Medication Dose Route Frequency Provider Last Rate Last Dose  . acetaminophen (TYLENOL) tablet 650 mg  650 mg Oral Q6H PRN Audery AmelJohn T Clapacs, MD      . alum & mag hydroxide-simeth (MAALOX/MYLANTA) 200-200-20 MG/5ML suspension 30 mL  30 mL Oral Q4H PRN Audery AmelJohn T Clapacs, MD      . bacitracin ointment   Topical PRN Audery AmelJohn T Clapacs, MD      . cilostazol (PLETAL) tablet 50 mg  50 mg Oral BID Audery AmelJohn T Clapacs, MD   50 mg at 11/05/16 0835  . clopidogrel (PLAVIX) tablet 75 mg  75 mg Oral Daily Audery AmelJohn T Clapacs, MD   75 mg at 11/05/16 0834  . diltiazem (CARDIZEM CD) 24 hr capsule 180 mg  180 mg  Oral Daily Audery Amel, MD   180 mg at 11/05/16 0835  . enalapril (VASOTEC) tablet 2.5 mg  2.5 mg Oral Daily Audery Amel, MD   2.5 mg at 11/05/16 0835  . [START ON 11/06/2016] Influenza vac split quadrivalent PF (FLUARIX) injection 0.5 mL  0.5 mL Intramuscular Tomorrow-1000 Inga Noller B Yoshiko Keleher, MD      . insulin aspart (novoLOG) injection 0-9 Units  0-9 Units Subcutaneous TID WC John T Clapacs, MD      . magnesium hydroxide (MILK OF MAGNESIA) suspension 30 mL  30 mL Oral Daily PRN Audery Amel, MD      . perphenazine (TRILAFON) tablet 4 mg  4 mg Oral BID Audery Amel, MD   4 mg at 11/05/16 0834  . rosuvastatin (CRESTOR) tablet 5 mg  5 mg Oral Daily Audery Amel, MD   5 mg at 11/05/16  0834  . temazepam (RESTORIL) capsule 15 mg  15 mg Oral QHS Shari Prows, MD   15 mg at 11/04/16 2142    Lab Results:  Results for orders placed or performed during the hospital encounter of 11/04/16 (from the past 48 hour(s))  Glucose, capillary     Status: Abnormal   Collection Time: 11/04/16  6:50 AM  Result Value Ref Range   Glucose-Capillary 113 (H) 65 - 99 mg/dL  Hemoglobin U9W     Status: Abnormal   Collection Time: 11/04/16  6:53 AM  Result Value Ref Range   Hgb A1c MFr Bld 5.9 (H) 4.8 - 5.6 %    Comment: (NOTE)         Pre-diabetes: 5.7 - 6.4         Diabetes: >6.4         Glycemic control for adults with diabetes: <7.0    Mean Plasma Glucose 123 mg/dL    Comment: (NOTE) Performed At: [facility] [address] [place], [place] [id number] Mila Homer MD FA:[id number]   Lipid panel     Status: Abnormal   Collection Time: 11/04/16  6:53 AM  Result Value Ref Range   Cholesterol 229 (H) 0 - 200 mg/dL   Triglycerides 75 <696 mg/dL   HDL 57 >29 mg/dL   Total CHOL/HDL Ratio 4.0 RATIO   VLDL 15 0 - 40 mg/dL   LDL Cholesterol 528 (H) 0 - 99 mg/dL    Comment:        Total Cholesterol/HDL:CHD Risk Coronary Heart Disease Risk Table                     Men   Women  1/2 Average Risk   3.4   3.3  Average Risk       5.0   4.4  2 X Average Risk   9.6   7.1  3 X Average Risk  23.4   11.0        Use the calculated Patient Ratio above and the CHD Risk Table to determine the patient's CHD Risk.        ATP III CLASSIFICATION (LDL):  <100     mg/dL   Optimal  413-244  mg/dL   Near or Above                    Optimal  130-159  mg/dL   Borderline  010-272  mg/dL   High  >536     mg/dL   Very High   Prolactin  Status: None   Collection Time: 11/04/16  6:53 AM  Result Value Ref Range   Prolactin 19.3 4.8 - 23.3 ng/mL    Comment: (NOTE) Performed At: [facility] [address] [place], [place] [id number] Mila HomerHancock William F MD UX:[id number]:[phone]    TSH     Status: None   Collection Time: 11/04/16  6:53 AM  Result Value Ref Range   TSH 1.465 0.350 - 4.500 uIU/mL    Comment: Performed by a 3rd Generation assay with a functional sensitivity of <=0.01 uIU/mL.  Glucose, capillary     Status: Abnormal   Collection Time: 11/04/16 12:27 PM  Result Value Ref Range   Glucose-Capillary 111 (H) 65 - 99 mg/dL   Comment 1 Notify RN    Comment 2 Document in Chart   Glucose, capillary     Status: Abnormal   Collection Time: 11/04/16  4:27 PM  Result Value Ref Range   Glucose-Capillary 123 (H) 65 - 99 mg/dL   Comment 1 Notify RN    Comment 2 Document in Chart   Glucose, capillary     Status: Abnormal   Collection Time: 11/05/16  6:40 AM  Result Value Ref Range   Glucose-Capillary 114 (H) 65 - 99 mg/dL  Glucose, capillary     Status: Abnormal   Collection Time: 11/05/16 11:59 AM  Result Value Ref Range   Glucose-Capillary 169 (H) 65 - 99 mg/dL   Comment 1 Notify RN     Blood Alcohol level:  Lab Results  Component Value Date   ETH <5 04/15/2016    Metabolic Disorder Labs: Lab Results  Component Value Date   HGBA1C 5.9 (H) 11/04/2016   MPG 123 11/04/2016   MPG 117 11/02/2016   Lab Results  Component Value Date   PROLACTIN 19.3 11/04/2016   PROLACTIN 15.7 04/18/2016   Lab Results  Component Value Date   CHOL 229 (H) 11/04/2016   TRIG 75 11/04/2016   HDL 57 11/04/2016   CHOLHDL 4.0 11/04/2016   VLDL 15 11/04/2016   LDLCALC 157 (H) 11/04/2016   LDLCALC 65 04/18/2016    Physical Findings: AIMS:  , ,  ,  ,    CIWA:    COWS:     Musculoskeletal: Strength & Muscle Tone: within normal limits Gait & Station: normal Patient leans: N/A  Psychiatric Specialty Exam: Physical Exam  Nursing note and vitals reviewed.   Review of Systems  Psychiatric/Behavioral: Positive for hallucinations. The patient has insomnia.   All other systems reviewed and are negative.   Blood pressure 109/69, pulse 87, temperature 98.2 F  (36.8 C), temperature source Oral, resp. rate 17, height 5\' 4"  (1.626 m), weight 49.9 kg (110 lb), SpO2 98 %.Body mass index is 18.88 kg/m.  General Appearance: Casual  Eye Contact:  Good  Speech:  Clear and Coherent  Volume:  Normal  Mood:  Angry, Dysphoric and Irritable  Affect:  Congruent  Thought Process:  Disorganized and Descriptions of Associations: Tangential  Orientation:  Full (Time, Place, and Person)  Thought Content:  Delusions and Paranoid Ideation  Suicidal Thoughts:  No  Homicidal Thoughts:  No  Memory:  Immediate;   Fair Recent;   Fair Remote;   Fair  Judgement:  Impaired  Insight:  Lacking  Psychomotor Activity:  Normal  Concentration:  Concentration: Fair and Attention Span: Fair  Recall:  F[employer]Fair  Fund of Knowledge:  Fair  Language:  Fair  Akathisia:  No  Handed:  Right  AIMS (if  indicated):     Assets:  Communication Skills Desire for Improvement Financial Resources/Insurance Physical Health Resilience  ADL's:  Intact  Cognition:  WNL  Sleep:  Number of Hours: 4.5     Treatment Plan Summary: Daily contact with patient to assess and evaluate symptoms and progress in treatment and Medication management   Ms. Mcilwain is a 65 year old female with a history of bipolar disorder admitted in a manic psychotic episode in the context of treatment noncompliance.  1. Psychosis. She was restarted on perphenazine. She refused a mood stabilizer.   2. Insomnia. I will increase Restoril to 30 mg.   3. Smoking. Nicotine patch was available.  4. Hypertension. She is on Cardizem and enalapril.  5. Coronary artery disease. She is on Plavix, Crestor, and Pletal.  6. Diabetes. She is taking to Trulicity at home and refuses insulin in the hospital. She is on sliding scale insulin, blood glucose monitoring, ADA diet.  7. COPD. She is on inhaler.  8. Metabolic syndrome monitoring. Labs completed during previous admission.   9. Disposition. She is being  evicted. We will attempt to get ACT team support. Otherwise, she will need a follow-up with local psychiatrist.   Kristine Linea, MD 11/05/2016, 12:19 PM

## 2016-11-05 NOTE — Plan of Care (Signed)
Problem: Education: Goal: Will be free of psychotic symptoms Outcome: Not Progressing Pt blaming police and neighbors for hospitalization. She is unable to identify why she is here.  Problem: Safety: Goal: Ability to remain free from injury will improve Outcome: Progressing Pt remains free from harm. Bacitracin ointment applied to knuckle as prescribed.

## 2016-11-05 NOTE — Plan of Care (Signed)
Problem: Coping: Goal: Ability to cope will improve Outcome: Progressing Patient calm  Attending unit programing

## 2016-11-06 LAB — GLUCOSE, CAPILLARY
Glucose-Capillary: 137 mg/dL — ABNORMAL HIGH (ref 65–99)
Glucose-Capillary: 117 mg/dL — ABNORMAL HIGH (ref 65–99)
Glucose-Capillary: 120 mg/dL — ABNORMAL HIGH (ref 65–99)

## 2016-11-06 NOTE — Plan of Care (Signed)
Problem: Coping: Goal: Ability to cope will improve Outcome: Progressing Continue to work on coping  skills   

## 2016-11-06 NOTE — Progress Notes (Signed)
Recreation Therapy Notes  Date: 11.16.17 Time: 9:30 am Location: Craft Room  Group Topic: Coping Skills/Leisure Education  Goal Area(s) Addresses:  Patient will identify things they are grateful for. Patient will identify how being grateful can influence decision making.  Behavioral Response: Attentive, Interactive, Off topic  Intervention: Grateful Wheel  Activity: Patients were given an I Am Grateful For worksheet and were instructed to write things they were grateful for under each category.  Education: LRT educated patient on why it is important to be grateful.  Education Outcome: In group clarification offered   Clinical Observations/Feedback: Patient wrote things she is grateful for. Patient contributed to group discussion by stating things she is grateful for and what category has more things she is grateful for in it. Patient got off topic and talked about shopping with her sister and how her sister shops for the whole day. Patient difficult to redirect.  Jacquelynn CreeGreene,Elexa Kivi M, LRT/CTRS 11/06/2016 10:30 AM

## 2016-11-06 NOTE — Plan of Care (Signed)
Problem: Activity: Goal: Will verbalize the importance of balancing activity with adequate rest periods Outcome: Not Progressing Pt is unable to verbalize the importance of balancing activity with rest periods.  Problem: Education: Goal: Will be free of psychotic symptoms Outcome: Not Progressing Pt is still disorganized and confused at times. Goal: Knowledge of the prescribed therapeutic regimen will improve Outcome: Not Progressing Pt is not knowledgeable of prescribed therapeutic regimen, but is medication compliant.   Problem: Coping: Goal: Ability to verbalize feelings will improve Outcome: Progressing Pt verbalizes feelings to staff.

## 2016-11-06 NOTE — [facility] Group Notes (Signed)
[facility] Group Notes:  (Nursing/MHT/Case Management/Adjunct)  Date:  11/06/2016  Time:  3:45 AM  Type of Therapy:  Psychoeducational Skills  Participation Level:  Active  Participation Quality:  Appropriate, Attentive and Sharing  Affect:  Appropriate  Cognitive:  Appropriate  Insight:  Appropriate and Good  Engagement in Group:  Engaged  Modes of Intervention:  Discussion, Socialization and Support  Summary of Progress/Problems:  Deanna MilroyLaquanda Y Kourtni [name] 11/06/2016, 3:45 AM

## 2016-11-06 NOTE — Tx Team (Signed)
Interdisciplinary Treatment and Diagnostic Plan Update  11/06/2016 Time of Session: 10:30am Deanna CeaDeborah K [name] MRN: [id number]  Principal Diagnosis: Bipolar I disorder, most recent episode manic, severe with psychotic features (HCC)  Secondary Diagnoses: Principal Problem:   Bipolar I disorder, most recent episode manic, severe with psychotic features (HCC) Active Problems:   Diabetes mellitus without complication (HCC)   COPD (chronic obstructive pulmonary disease) (HCC)   Hypertension   Coronary artery disease   Tobacco use disorder   Current Medications:  Current Facility-Administered Medications  Medication Dose Route Frequency Provider Last Rate Last Dose  . acetaminophen (TYLENOL) tablet 650 mg  650 mg Oral Q6H PRN Audery AmelJohn T Clapacs, MD      . alum & mag hydroxide-simeth (MAALOX/MYLANTA) 200-200-20 MG/5ML suspension 30 mL  30 mL Oral Q4H PRN Audery AmelJohn T Clapacs, MD      . bacitracin ointment   Topical PRN Audery AmelJohn T Clapacs, MD   1 application at 11/05/16 1742  . cilostazol (PLETAL) tablet 50 mg  50 mg Oral BID Audery AmelJohn T Clapacs, MD   50 mg at 11/06/16 0837  . clopidogrel (PLAVIX) tablet 75 mg  75 mg Oral Daily Audery AmelJohn T Clapacs, MD   75 mg at 11/06/16 0837  . diltiazem (CARDIZEM CD) 24 hr capsule 180 mg  180 mg Oral Daily Audery AmelJohn T Clapacs, MD   180 mg at 11/05/16 0835  . enalapril (VASOTEC) tablet 2.5 mg  2.5 mg Oral Daily Audery AmelJohn T Clapacs, MD   2.5 mg at 11/05/16 0835  . Influenza vac split quadrivalent PF (FLUARIX) injection 0.5 mL  0.5 mL Intramuscular Tomorrow-1000 Jolanta B Pucilowska, MD      . insulin aspart (novoLOG) injection 0-9 Units  0-9 Units Subcutaneous TID WC John T Clapacs, MD      . magnesium hydroxide (MILK OF MAGNESIA) suspension 30 mL  30 mL Oral Daily PRN Audery AmelJohn T Clapacs, MD      . perphenazine (TRILAFON) tablet 4 mg  4 mg Oral BID Audery AmelJohn T Clapacs, MD   4 mg at 11/06/16 0837  . rosuvastatin (CRESTOR) tablet 5 mg  5 mg Oral Daily Audery AmelJohn T Clapacs, MD   5 mg at 11/06/16 0837  .  temazepam (RESTORIL) capsule 30 mg  30 mg Oral QHS Shari ProwsJolanta B Pucilowska, MD   30 mg at 11/05/16 2135   PTA Medications: Prescriptions Prior to Admission  Medication Sig Dispense Refill Last Dose  . cilostazol (PLETAL) 50 MG tablet Take 50 mg by mouth 2 (two) times daily.  3 unknown at unknown  . clopidogrel (PLAVIX) 75 MG tablet Take 75 mg by mouth daily.  5 unknown at Unknown time  . diltiazem (CARDIZEM CD) 180 MG 24 hr capsule Take 1 capsule by mouth daily.  2 unknown at unknown  . enalapril (VASOTEC) 2.5 MG tablet Take 2.5 mg by mouth daily.  3 unknown at unknown  . rosuvastatin (CRESTOR) 5 MG tablet Take 5 mg by mouth daily.  1 unknown at unknown  . TRULICITY 0.75 MG/0.5ML SOPN once a week.  2 unknown at unknown    Patient Stressors: Financial difficulties Health problems Loss of housing  Patient Strengths: Active sense of humor Communication skills General fund of knowledge  Treatment Modalities: Medication Management, Group therapy, Case management,  1 to 1 session with clinician, Psychoeducation, Recreational therapy.   Physician Treatment Plan for Primary Diagnosis: Bipolar I disorder, most recent episode manic, severe with psychotic features (HCC) Long Term Goal(s): Improvement in symptoms so as ready for  discharge NA   Short Term Goals: Ability to identify changes in lifestyle to reduce recurrence of condition will improve Ability to verbalize feelings will improve Ability to disclose and discuss suicidal ideas Ability to demonstrate self-control will improve Ability to identify and develop effective coping behaviors will improve Ability to maintain clinical measurements within normal limits will improve Compliance with prescribed medications will improve NA  Medication Management: Evaluate patient's response, side effects, and tolerance of medication regimen.  Therapeutic Interventions: 1 to 1 sessions, Unit Group sessions and Medication  administration.  Evaluation of Outcomes: Progressing  Physician Treatment Plan for Secondary Diagnosis: Principal Problem:   Bipolar I disorder, most recent episode manic, severe with psychotic features (HCC) Active Problems:   Diabetes mellitus without complication (HCC)   COPD (chronic obstructive pulmonary disease) (HCC)   Hypertension   Coronary artery disease   Tobacco use disorder  Long Term Goal(s): Improvement in symptoms so as ready for discharge NA   Short Term Goals: Ability to identify changes in lifestyle to reduce recurrence of condition will improve Ability to verbalize feelings will improve Ability to disclose and discuss suicidal ideas Ability to demonstrate self-control will improve Ability to identify and develop effective coping behaviors will improve Ability to maintain clinical measurements within normal limits will improve Compliance with prescribed medications will improve NA     Medication Management: Evaluate patient's response, side effects, and tolerance of medication regimen.  Therapeutic Interventions: 1 to 1 sessions, Unit Group sessions and Medication administration.  Evaluation of Outcomes: Progressing   RN Treatment Plan for Primary Diagnosis: Bipolar I disorder, most recent episode manic, severe with psychotic features (HCC) Long Term Goal(s): Knowledge of disease and therapeutic regimen to maintain health will improve  Short Term Goals: Ability to remain free from injury will improve, Ability to verbalize frustration and anger appropriately will improve, Ability to demonstrate self-control, Ability to participate in decision making will improve, Ability to verbalize feelings will improve and Ability to disclose and discuss suicidal ideas  Medication Management: RN will administer medications as ordered by provider, will assess and evaluate patient's response and provide education to patient for prescribed medication. RN will report any adverse  and/or side effects to prescribing provider.  Therapeutic Interventions: 1 on 1 counseling sessions, Psychoeducation, Medication administration, Evaluate responses to treatment, Monitor vital signs and CBGs as ordered, Perform/monitor CIWA, COWS, AIMS and Fall Risk screenings as ordered, Perform wound care treatments as ordered.  Evaluation of Outcomes: Progressing   LCSW Treatment Plan for Primary Diagnosis: Bipolar I disorder, most recent episode manic, severe with psychotic features (HCC) Long Term Goal(s): Safe transition to appropriate next level of care at discharge, Engage patient in therapeutic group addressing interpersonal concerns.  Short Term Goals: Engage patient in aftercare planning with referrals and resources, Increase social support, Increase ability to appropriately verbalize feelings, Increase emotional regulation, Facilitate acceptance of mental health diagnosis and concerns and Increase skills for wellness and recovery  Therapeutic Interventions: Assess for all discharge needs, 1 to 1 time with Social worker, Explore available resources and support systems, Assess for adequacy in community support network, Educate family and significant other(s) on suicide prevention, Complete Psychosocial Assessment, Interpersonal group therapy.  Evaluation of Outcomes: Progressing   Progress in Treatment: Attending groups: Yes. Participating in groups: Yes. Taking medication as prescribed: Yes. Toleration medication: Yes. Family/Significant other contact made: No, will contact:  CSW Patient understands diagnosis: Yes. Discussing patient identified problems/goals with staff: Yes. Medical problems stabilized or resolved: Yes. Denies suicidal/homicidal ideation:  Yes. Issues/concerns per patient self-inventory: Yes. Other: none reported  New problem(s) identified: Yes, Describe:  None reported  New Short Term/Long Term Goal(s): Pt hopes o resolve housing worries due to upcoming  eviction   Discharge Plan or Barriers: CSW still assessing for appropriate referrals  Reason for Continuation of Hospitalization: Anxiety Delusions  Depression Hallucinations  Estimated Length of Stay:  Attendees: Patient: Deanna [name] 11/06/2016 10:56 AM  Physician: Dr. Jennet Maduro, MD 11/06/2016 10:56 AM  Nursing: Hulan Amato, RN 11/06/2016 10:56 AM  RN Care Manager: 11/06/2016 10:56 AM  Social Worker: Dorothe Pea. Roylene Reason, LCAS 11/06/2016 10:56 AM  Recreational Therapist: Hershal Coria, LRT 11/06/2016 10:56 AM  Other:  11/06/2016 10:56 AM  Other:  11/06/2016 10:56 AM  Other: 11/06/2016 10:56 AM    Scribe for Treatment Team: Dorothe Pea Micaiah Litle, LCSWA 11/06/2016

## 2016-11-06 NOTE — Progress Notes (Signed)
Pt is alert and oriented x3, respirations even and unlabored, gait steady and unassisted, no acute distress noted. Denies having any pain or discomfort. Denies SI/HI/AVH and depression but does report experiencing anxiety of 3/10. Reports anxiety is "I have so much going on right now. I don't know where to begin." No complaints  Voiced. Is medication compliant. Remains on q15 minute observation checks for safety. Will continue to monitor.

## 2016-11-06 NOTE — Progress Notes (Signed)
[facility] MD Progress Note  11/06/2016 12:41 PM Deanna [name]  MRN:  [id number]  Subjective:  Deanna [name] met with treatment team today. Initially she seemed to be better able to "hold it together". As our conversation continued, she became more and more disorganized, incoherent and paranoid. She however did not get upset with [place] and tried very hard to participate in discharge planning. He will be evicted from her apartment on December 7. She would like to be discharged in time to find a different place and straighten out her financial situation. Apparently the patient has been dysfunctional since her discharge in May and got herself into all sorts of troubles including losing her car getting legal charges for not paying her [place] to a taxi driver and conflict at the apartment complex leading to termination of her lease. Unfortunately she accepts Trilafon only and will not change her medications. She is getting better on Trilafon. She will meet with [employer] team representative today. We hope that she will be able to obtain the services.   Principal Problem: Bipolar I disorder, most recent episode manic, severe with psychotic features ([place]) Diagnosis:   Patient Active Problem List   Diagnosis Date Noted  . Bipolar I disorder, most recent episode manic, severe with psychotic features ([place]) [F31.2] 04/16/2016  . Tobacco use disorder [F17.200] 04/16/2016  . Diabetes mellitus without complication ([place]) [F16.3] 04/15/2016  . Kidney disease [N28.9] 04/15/2016  . COPD (chronic obstructive pulmonary disease) ([place]) [J44.9] 04/15/2016  . Hypertension [I10] 04/15/2016  . Coronary artery disease [I25.10] 04/15/2016   Total Time spent with patient: 20 minutes  Past Psychiatric History: Bipolar disorder.  Past Medical History:  Past Medical History:  Diagnosis Date  . COPD (chronic obstructive pulmonary disease) ([place])   . Diabetes mellitus without complication ([place])   . GERD (gastroesophageal  reflux disease)   . Kidney disease    History reviewed. No pertinent surgical history. Family History: History reviewed. No pertinent family history. Family Psychiatric  History: See H&P. Social History:  History  Alcohol Use No     History  Drug Use No    Social History   Social History  . Marital status: Divorced    Spouse name: N/A  . Number of children: N/A  . Years of education: N/A   Social History Main Topics  . Smoking status: Current Every Day Smoker    Packs/day: 0.50    Years: 40.00    Types: Cigarettes  . Smokeless tobacco: Current User    Types: Snuff     Comment: not interested in quitting  . Alcohol use No  . Drug use: No  . Sexual activity: Not Currently    Birth control/ protection: None   Other Topics Concern  . None   Social History Narrative  . None   Additional Social History:                         Sleep: Poor  Appetite:  Fair  Current Medications: Current Facility-Administered Medications  Medication Dose Route Frequency Provider Last Rate Last Dose  . acetaminophen (TYLENOL) tablet 650 mg  650 mg Oral Q6H PRN Gonzella Lex, MD      . alum & mag hydroxide-simeth (MAALOX/MYLANTA) 200-200-20 MG/5ML suspension 30 mL  30 mL Oral Q4H PRN Gonzella Lex, MD      . bacitracin ointment   Topical PRN Gonzella Lex, MD   1 application at 84/66/59 1742  .  cilostazol (PLETAL) tablet 50 mg  50 mg Oral BID Gonzella Lex, MD   50 mg at 11/06/16 0837  . clopidogrel (PLAVIX) tablet 75 mg  75 mg Oral Daily Gonzella Lex, MD   75 mg at 11/06/16 0837  . diltiazem (CARDIZEM CD) 24 hr capsule 180 mg  180 mg Oral Daily Gonzella Lex, MD   180 mg at 11/05/16 0835  . enalapril (VASOTEC) tablet 2.5 mg  2.5 mg Oral Daily Gonzella Lex, MD   2.5 mg at 11/05/16 0835  . Influenza vac split quadrivalent PF (FLUARIX) injection 0.5 mL  0.5 mL Intramuscular Tomorrow-1000 Ronte Parker B Romelle Reiley, MD      . insulin aspart (novoLOG) injection 0-9 Units  0-9  Units Subcutaneous TID WC John T Clapacs, MD      . magnesium hydroxide (MILK OF MAGNESIA) suspension 30 mL  30 mL Oral Daily PRN Gonzella Lex, MD      . perphenazine (TRILAFON) tablet 4 mg  4 mg Oral BID Gonzella Lex, MD   4 mg at 11/06/16 0837  . rosuvastatin (CRESTOR) tablet 5 mg  5 mg Oral Daily Gonzella Lex, MD   5 mg at 11/06/16 0837  . temazepam (RESTORIL) capsule 30 mg  30 mg Oral QHS Clovis Fredrickson, MD   30 mg at 11/05/16 2135    Lab Results:  Results for orders placed or performed during the hospital encounter of 11/04/16 (from the past 48 hour(s))  Glucose, capillary     Status: Abnormal   Collection Time: 11/04/16  4:27 PM  Result Value Ref Range   Glucose-Capillary 123 (H) 65 - 99 mg/dL   Comment 1 Notify RN    Comment 2 Document in Chart   Glucose, capillary     Status: Abnormal   Collection Time: 11/05/16  6:40 AM  Result Value Ref Range   Glucose-Capillary 114 (H) 65 - 99 mg/dL  Glucose, capillary     Status: Abnormal   Collection Time: 11/05/16 11:59 AM  Result Value Ref Range   Glucose-Capillary 169 (H) 65 - 99 mg/dL   Comment 1 Notify RN   Glucose, capillary     Status: Abnormal   Collection Time: 11/05/16  4:30 PM  Result Value Ref Range   Glucose-Capillary 112 (H) 65 - 99 mg/dL   Comment 1 Notify RN   Glucose, capillary     Status: Abnormal   Collection Time: 11/06/16  6:50 AM  Result Value Ref Range   Glucose-Capillary 137 (H) 65 - 99 mg/dL  Glucose, capillary     Status: Abnormal   Collection Time: 11/06/16 12:02 PM  Result Value Ref Range   Glucose-Capillary 117 (H) 65 - 99 mg/dL    Blood Alcohol level:  Lab Results  Component Value Date   ETH <5 81/85/6314    Metabolic Disorder Labs: Lab Results  Component Value Date   HGBA1C 5.9 (H) 11/04/2016   MPG 123 11/04/2016   MPG 117 11/02/2016   Lab Results  Component Value Date   PROLACTIN 19.3 11/04/2016   PROLACTIN 15.7 04/18/2016   Lab Results  Component Value Date   CHOL 229  (H) 11/04/2016   TRIG 75 11/04/2016   HDL 57 11/04/2016   CHOLHDL 4.0 11/04/2016   VLDL 15 11/04/2016   LDLCALC 157 (H) 11/04/2016   LDLCALC 65 04/18/2016    Physical Findings: AIMS:  , ,  ,  ,    CIWA:    COWS:  Musculoskeletal: Strength & Muscle Tone: within normal limits Gait & Station: normal Patient leans: N/A  Psychiatric Specialty Exam: Physical Exam  Nursing note and vitals reviewed.   Review of Systems  Psychiatric/Behavioral: Positive for hallucinations. The patient has insomnia.   All other systems reviewed and are negative.   Blood pressure (!) 103/52, pulse 67, temperature 98 F (36.7 C), temperature source Oral, resp. rate 18, height _0  (1.626 m), weight 49.9 kg (110 lb), SpO2 98 %.Body mass index is 18.88 kg/m.  General Appearance: Casual  Eye Contact:  Good  Speech:  Clear and Coherent  Volume:  Normal  Mood:  Angry, Dysphoric and Irritable  Affect:  Congruent  Thought Process:  Disorganized and Descriptions of Associations: Tangential  Orientation:  Full (Time, Place, and Person)  Thought Content:  Delusions and Paranoid Ideation  Suicidal Thoughts:  No  Homicidal Thoughts:  No  Memory:  Immediate;   Fair Recent;   Fair Remote;   Fair  Judgement:  Impaired  Insight:  Lacking  Psychomotor Activity:  Normal  Concentration:  Concentration: Fair and Attention Span: Fair  Recall:  [employer] of Knowledge:  Fair  Language:  Fair  Akathisia:  No  Handed:  Right  AIMS (if indicated):     Assets:  Communication Skills Desire for Improvement Financial Resources/Insurance Physical Health Resilience  ADL's:  Intact  Cognition:  WNL  Sleep:  Number of Hours: 7.15     Treatment Plan Summary: Daily contact with patient to assess and evaluate symptoms and progress in treatment and Medication management   Ms. Ketcherside is a 65 year old female with a history of bipolar disorder admitted in a manic psychotic episode in the context of treatment  noncompliance.  1. Psychosis. She was restarted on perphenazine. She refused a mood stabilizer.   2. Insomnia. She slept well with higher dose of Restoril.    3. Smoking. Nicotine patch was available.  4. Hypertension. She is on Cardizem and enalapril.  5. Coronary artery disease. She is on Plavix, Crestor, and Pletal.  6. Diabetes. She is taking to Trulicity at home and refuses insulin in the hospital. She is on sliding scale insulin, blood glucose monitoring, ADA diet.  7. COPD. She is on inhaler.  8. Metabolic syndrome monitoring. Labs completed during previous admission.   9. Disposition. She is being evicted. We will attempt to get ACT team support. Otherwise, she will need a follow-up with local psychiatrist.   Orson Slick, MD 11/06/2016, 12:41 PM

## 2016-11-06 NOTE — [facility] Group Notes (Signed)
[facility] LCSW Group Therapy  11/06/2016 2:08 PM  Type of Therapy:  Group Therapy  Participation Level:  Active  Participation Quality:  Monopolizing and Resistant  Affect:  Angry, Irritable and Resistant  Cognitive:  Disorganized and Confused  Insight:  Off Topic and Resistant  Engagement in Therapy:  Distracting, Off Topic and Resistant  Modes of Intervention:  Activity, Discussion, Education and Support  Summary of Progress/Problems:Balance in life: Patients will discuss the concept of balance and how it looks and feels to be unbalanced. Pt will identify areas in their life that is unbalanced and ways to become more balanced. Patient was irritated and needed constant redirection to remain appropriate during group, however patient was unable to be redirected. Patient became confused and was talking about the police physically assaulting her stating "Those bastards brought me here for no reason"!  CSW provided support to patient.   Deanna [name] G. Garnette CzechSampson MSW, LCSWA 11/06/2016, 2:16 PM

## 2016-11-07 LAB — GLUCOSE, CAPILLARY
Glucose-Capillary: 125 mg/dL — ABNORMAL HIGH (ref 65–99)
Glucose-Capillary: 118 mg/dL — ABNORMAL HIGH (ref 65–99)
Glucose-Capillary: 132 mg/dL — ABNORMAL HIGH (ref 65–99)

## 2016-11-07 NOTE — Progress Notes (Signed)
Pt is refusing cilostazol (pletal) tonight. Reports "i'm not supposed to be taking that medication anymore because it causes me bruising and might cause internal bleeding."

## 2016-11-07 NOTE — Progress Notes (Signed)
Recreation Therapy Notes  Date: 11.17.17 Time: 9:30 am Location: Craft Room  Group Topic: Stress Management  Goal Area(s) Addresses:  Patient will participate in stress management techniques. Patient will verbalize benefit of using stress management techniques.  Behavioral Response: Attentive, Interactive  Intervention: Relaxation Techniques  Activity: Patients were given Stress Management techniques. LRT educated patients on stress management techniques and had patients practice the techniques.  Education: LRT educated patients on the importance of using the stress management techniques.  Education Outcome: Acknowledges education/In group clarification offered   Clinical Observations/Feedback: Patient participated in different stress management techniques. Patient contributed to group discussion by stating the techniques were helpful.  Jacquelynn CreeGreene,Khalee Mazo M, LRT/CTRS 11/07/2016 10:15 AM

## 2016-11-07 NOTE — Progress Notes (Signed)
Deanna [name]  MRN:  [id number]  Subjective:  Deanna [name] has a history of bipolar illness. She was hospitalized here for manic episode in May 2017. After discharge she did not follow-up with a psychiatrist and has been off medications ever since. She was brought to the hospital for agitated, paranoid, disorganized behavior. As she was getting worse she made a mess of her finances and currently has no access to her bank account. She also is being evicted from her apartment on December 7. She was interviewed on 11/16 by [facility] and accepted to their service. The patient was doing well as long as she stayed with Dr. Johny Drillinghan who has retired a year or so ago. She would not accept medications other than Trilafon. It works for her when compliant.  Deanna [name] is still very disorganized and paranoid. She still refuses any additional medications except for Trilafon. She denies any symptoms of depression, anxiety or psychosis and demands discharge. Tolerates Trilafon well. Good group participation.    Principal Problem: Bipolar I disorder, most recent episode manic, severe with psychotic features ([facility]) Diagnosis:   Patient Active Problem List   Diagnosis Date Noted  . Bipolar I disorder, most recent episode manic, severe with psychotic features ([facility]) [F31.2] 04/16/2016  . Tobacco use disorder [F17.200] 04/16/2016  . Diabetes mellitus without complication ([facility]) [E11.9] 04/15/2016  . Kidney disease [N28.9] 04/15/2016  . COPD (chronic obstructive pulmonary disease) ([facility]) [J44.9] 04/15/2016  . Hypertension [I10] 04/15/2016  . Coronary artery disease [I25.10] 04/15/2016   Total Time spent with patient: 20 minutes  Past Psychiatric History: Bipolar disorder.  Past Medical History:  Past Medical History:  Diagnosis Date  . COPD (chronic obstructive pulmonary disease) ([facility])   . Diabetes mellitus without complication ([facility])   . GERD  (gastroesophageal reflux disease)   . Kidney disease    History reviewed. No pertinent surgical history. Family History: History reviewed. No pertinent family history. Family Psychiatric  History: See H&P. Social History:  History  Alcohol Use No     History  Drug Use No    Social History   Social History  . Marital status: Divorced    Spouse name: N/A  . Number of children: N/A  . Years of education: N/A   Social History Main Topics  . Smoking status: Current Every Day Smoker    Packs/day: 0.50    Years: 40.00    Types: Cigarettes  . Smokeless tobacco: Current User    Types: Snuff     Comment: not interested in quitting  . Alcohol use No  . Drug use: No  . Sexual activity: Not Currently    Birth control/ protection: None   Other Topics Concern  . None   Social History Narrative  . None   Additional Social History:                         Sleep: Poor  Appetite:  Fair  Current Medications: Current Facility-Administered Medications  Medication Dose Route Frequency Provider Last Rate Last Dose  . acetaminophen (TYLENOL) tablet 650 mg  650 mg Oral Q6H PRN Audery AmelJohn T Clapacs, MD      . alum & mag hydroxide-simeth (MAALOX/MYLANTA) 200-200-20 MG/5ML suspension 30 mL  30 mL Oral Q4H PRN Audery AmelJohn T Clapacs, MD      . bacitracin ointment   Topical PRN Audery AmelJohn T Clapacs, MD   1 application at  11/05/16 1742  . cilostazol (PLETAL) tablet 50 mg  50 mg Oral BID Audery Amel, MD   50 mg at 11/07/16 0854  . clopidogrel (PLAVIX) tablet 75 mg  75 mg Oral Daily Audery Amel, MD   75 mg at 11/07/16 0855  . diltiazem (CARDIZEM CD) 24 hr capsule 180 mg  180 mg Oral Daily Audery Amel, MD   180 mg at 11/07/16 0855  . enalapril (VASOTEC) tablet 2.5 mg  2.5 mg Oral Daily Audery Amel, MD   2.5 mg at 11/07/16 0854  . Influenza vac split quadrivalent PF (FLUARIX) injection 0.5 mL  0.5 mL Intramuscular Tomorrow-1000 Arlean Thies B Nitesh Pitstick, MD      . insulin aspart (novoLOG) injection  0-9 Units  0-9 Units Subcutaneous TID WC John T Clapacs, MD      . magnesium hydroxide (MILK OF MAGNESIA) suspension 30 mL  30 mL Oral Daily PRN Audery Amel, MD      . perphenazine (TRILAFON) tablet 4 mg  4 mg Oral BID Audery Amel, MD   4 mg at 11/07/16 0855  . rosuvastatin (CRESTOR) tablet 5 mg  5 mg Oral Daily Audery Amel, MD   5 mg at 11/07/16 0855  . temazepam (RESTORIL) capsule 30 mg  30 mg Oral QHS Jacquelinne Speak B Amali Uhls, MD   30 mg at 11/05/16 2135    Lab Results:  Results for orders placed or performed during the hospital encounter of 11/04/16 (from the past 48 hour(s))  Glucose, capillary     Status: Abnormal   Collection Time: 11/05/16  4:30 PM  Result Value Ref Range   Glucose-Capillary 112 (H) 65 - 99 mg/dL   Comment 1 Notify RN   Glucose, capillary     Status: Abnormal   Collection Time: 11/06/16  6:50 AM  Result Value Ref Range   Glucose-Capillary 137 (H) 65 - 99 mg/dL  Glucose, capillary     Status: Abnormal   Collection Time: 11/06/16 12:02 PM  Result Value Ref Range   Glucose-Capillary 117 (H) 65 - 99 mg/dL  Glucose, capillary     Status: Abnormal   Collection Time: 11/06/16  5:15 PM  Result Value Ref Range   Glucose-Capillary 120 (H) 65 - 99 mg/dL  Glucose, capillary     Status: Abnormal   Collection Time: 11/07/16  6:28 AM  Result Value Ref Range   Glucose-Capillary 125 (H) 65 - 99 mg/dL   Comment 1 Notify RN   Glucose, capillary     Status: Abnormal   Collection Time: 11/07/16 11:38 AM  Result Value Ref Range   Glucose-Capillary 118 (H) 65 - 99 mg/dL    Blood Alcohol level:  Lab Results  Component Value Date   ETH <5 04/15/2016    Metabolic Disorder Labs: Lab Results  Component Value Date   HGBA1C 5.9 (H) 11/04/2016   MPG 123 11/04/2016   MPG 117 11/02/2016   Lab Results  Component Value Date   PROLACTIN 19.3 11/04/2016   PROLACTIN 15.7 04/18/2016   Lab Results  Component Value Date   CHOL 229 (H) 11/04/2016   TRIG 75 11/04/2016    HDL 57 11/04/2016   CHOLHDL 4.0 11/04/2016   VLDL 15 11/04/2016   LDLCALC 157 (H) 11/04/2016   LDLCALC 65 04/18/2016    Physical Findings: AIMS:  , ,  ,  ,    CIWA:    COWS:     Musculoskeletal: Strength & Muscle Tone: within normal  limits Gait & Station: normal Patient leans: N/A  Psychiatric Specialty Exam: Physical Exam  Nursing note and vitals reviewed.   Review of Systems  Psychiatric/Behavioral: Positive for hallucinations. The patient has insomnia.   All other systems reviewed and are negative.   Blood pressure (!) 146/81, pulse 88, temperature 98 F (36.7 C), temperature source Oral, resp. rate 19, height 5\' 4"  (1.626 m), weight 49.9 kg (110 lb), SpO2 98 %.Body mass index is 18.88 kg/m.  General Appearance: Casual  Eye Contact:  Good  Speech:  Clear and Coherent  Volume:  Normal  Mood:  Angry, Dysphoric and Irritable  Affect:  Congruent  Thought Process:  Disorganized and Descriptions of Associations: Tangential  Orientation:  Full (Time, Place, and Person)  Thought Content:  Delusions and Paranoid Ideation  Suicidal Thoughts:  No  Homicidal Thoughts:  No  Memory:  Immediate;   Fair Recent;   Fair Remote;   Fair  Judgement:  Impaired  Insight:  Lacking  Psychomotor Activity:  Normal  Concentration:  Concentration: Fair and Attention Span: Fair  Recall:  [employer]  Fund of Knowledge:  Fair  Language:  Fair  Akathisia:  No  Handed:  Right  AIMS (if indicated):     Assets:  Communication Skills Desire for Improvement Financial Resources/Insurance Physical Health Resilience  ADL's:  Intact  Cognition:  WNL  Sleep:  Number of Hours: 5.75     Treatment Plan Summary: Daily contact with patient to assess and evaluate symptoms and progress in treatment and Medication management   Deanna [name] is a 65 year old female with a history of bipolar disorder admitted in a manic psychotic episode in the context of treatment noncompliance.  1. Psychosis. She was  restarted on perphenazine. She refused a mood stabilizer.   2. Insomnia. She slept well with higher dose of Restoril.    3. Smoking. Nicotine patch was available.  4. Hypertension. She is on Cardizem and enalapril.  5. Coronary artery disease. She is on Plavix, Crestor, and Pletal.  6. Diabetes. She is taking to Trulicity at home and refuses insulin in the hospital. She is on sliding scale insulin, blood glucose monitoring, ADA diet.  7. COPD. She is on inhaler.  8. Metabolic syndrome monitoring. Labs completed during previous admission.   9. Disposition. She is being evicted but can return to her appartment until 12/7. She will [facility].    Kristine LineaJolanta Camia Dipinto, MD 11/07/2016, 1:38 PM

## 2016-11-07 NOTE — Plan of Care (Signed)
Problem: Safety: Goal: Ability to remain free from injury will improve Outcome: Progressing Pt has remained injury free.   

## 2016-11-07 NOTE — Progress Notes (Signed)
D: Observed pt in dayroom interacting with peers. Patient alert and oriented x4. Patient denies SI/HI/AVH. Pt affect is anxious, irritable, and blunted. When writer went to assess pt, she immediately stated "I didn't do anything to get here.Marland Kitchen.Marland Kitchen.I'm not a thief." Pt later during interacting interjected "I'm a peace activist."  Pt stated her mood is "guarded." Pt did not forward anything else this evening.  A: Offered active listening and support. Provided therapeutic communication.  R: Pt pleasant and cooperative. Pt refused night dose of restoril. Will continue Q15 min. checks. Safety maintained.

## 2016-11-07 NOTE — Progress Notes (Signed)
Pt denies SI/HI/AVH. Refused evening Restoril stating that she had told the MD that she was allergic to it and that it made her stomach hurt. Also reported to Clinical research associatewriter that she found out that her nephew had committed suicide sometime in the past but she is just now finding out about it. Denies pain.  Suspicious and preoccupied. Voices no additional concerns at this.

## 2016-11-07 NOTE — Progress Notes (Signed)
Pt awake, alert and oriented today up on unit. Continues to be paranoid, irritable, blunted. Focused on discharge and states "I can't get anything done locked up in here. Bring me my clothes. I'm going home." Pt informed that doctor would have to order discharge before pt can go home. Pt did verify that "the doctor says she's not discharging me until next week." Pt observed yelling down the hall at other pt's "to be quiet, you're disturbing me!" Medication compliant, but questioned every medication. Reports fair sleep last night, [facility] pt refused Restoril. Reports good appetite, normal-high energy, good concentration. Rates depression 4/10, hopelessness 0/10, anxiety 5/10 (low 0-10 high). Goal today is "affirmative action resulting in a serene d/c" by "co-operate to an equal extent."   Support and encouragement provided with use of therapeutic communication. Medications administered as ordered with education. Safety maintained with every 15 minute checks. Will continue to monitor.

## 2016-11-07 NOTE — Plan of Care (Signed)
Problem: Health Behavior/Discharge Planning: Goal: Compliance with prescribed medication regimen will improve Outcome: Not Progressing Pt refused Restoril last night per MAR. Questions each medication given.

## 2016-11-07 NOTE — [facility] Group Notes (Signed)
[facility] LCSW Group Therapy  11/07/2016 1:56 PM  Type of Therapy:  Group Therapy  Participation Level:  Active  Participation Quality:  Appropriate and Sharing  Affect:  Appropriate  Cognitive:  Alert  Insight:  None  Engagement in Therapy:  Engaged  Modes of Intervention:  Activity, Discussion, Education and Support  Summary of Progress/Problems:Feelings around Relapse. Group members discussed the meaning of relapse and shared personal stories of relapse, how it affected them and others, and how they perceived themselves during this time. Group members were encouraged to identify triggers, warning signs and coping skills used when facing the possibility of relapse. Social supports were discussed and explored in detail. Patients also discussed facing disappointment and how that can trigger someone to relapse. Patient appropriately participated in the group coloring activity.    Teddy Pena G. Garnette CzechSampson MSW, LCSWA 11/07/2016, 1:58 PM

## 2016-11-08 LAB — GLUCOSE, CAPILLARY
Glucose-Capillary: 150 mg/dL — ABNORMAL HIGH (ref 65–99)
Glucose-Capillary: 129 mg/dL — ABNORMAL HIGH (ref 65–99)
Glucose-Capillary: 132 mg/dL — ABNORMAL HIGH (ref 65–99)
Glucose-Capillary: 185 mg/dL — ABNORMAL HIGH (ref 65–99)

## 2016-11-08 MED ORDER — GLUCERNA SHAKE PO LIQD
237.0000 mL | Freq: Two times a day (BID) | ORAL | Status: DC
  Administered 2016-11-08 – 2016-11-10 (×5): 237 mL via ORAL

## 2016-11-08 MED ORDER — ASPIRIN 81 MG PO CHEW
81.0000 mg | CHEWABLE_TABLET | Freq: Every day | ORAL | Status: DC
  Administered 2016-11-09 – 2016-11-10 (×2): 81 mg via ORAL
  Filled 2016-11-08 (×3): qty 1

## 2016-11-08 NOTE — Plan of Care (Signed)
Problem: Activity: Goal: Will verbalize the importance of balancing activity with adequate rest periods Outcome: Progressing Pt verbalizes the importance of activity and rest.  Problem: Health Behavior/Discharge Planning: Goal: Compliance with prescribed medication regimen will improve Outcome: Progressing Pt is medication compliant.  Problem: Safety: Goal: Ability to redirect hostility and anger into socially appropriate behaviors will improve Outcome: Progressing Pt will continue to redirect hostility and anger into socially appropriate behaviors.  Problem: Safety: Goal: Ability to remain free from injury will improve Outcome: Progressing Pt will remain free from injury.

## 2016-11-08 NOTE — Progress Notes (Signed)
[facility]  11/08/2016 11:07 AM Deanna [name]  MRN:  [id number]  Subjective:  Deanna [name] has a history of bipolar illness. She was hospitalized here for manic episode in May 2017. After discharge she did not follow-up with a psychiatrist and has been off medications ever since. She was brought to [facility] for agitated, paranoid, disorganized behavior. As she was getting worse she made a mess of her finances and currently has no access to her bank account. She also is being evicted from her apartment on December 7. She was interviewed on 11/16 by [facility] and accepted to their service. Deanna [name] was doing well as long as she stayed with Dr. Johny Drillinghan who has retired a year or so ago. She would not accept medications other than Trilafon. It works for her when compliant.  11/17 Deanna [name] is still very disorganized and paranoid. She still refuses any additional medications except for Trilafon. She denies any symptoms of depression, anxiety or psychosis and demands discharge. Tolerates Trilafon well. Good group participation.    11/18 [name] says that yesterday she received a phone call from a family member. She received bad news as her nephew committed suicide by heroin overdose. [name] says she was in shock when she received Deanna news. Her other than that she feels more optimistic. She plans to attend NA meetings after discharge. She reports his sleeping very well. She denies problems with appetite, energy or concentration. She denies suicidality, homicidality or having auditory or visual hallucinations.  [name] states that [facility] nurse was trying to give her Seroquel last night but she refuses because she had had an adverse reaction to Seroquel (per nursing not look like he was Restoril) in Deanna past. I'm looking her records but I do not see an order for Seroquel. I assured her that this medication is not ordered for today. She also complained of having some left-sided  abdominal pain of mild intensity. She thinks it might be from eating too much. She requested Glucerna shakes  Per nursing: Pt denies SI/HI/AVH. Refused evening Restoril stating that she had told the MD that she was allergic to it and that it made her stomach hurt. Also reported to Clinical research associatewriter that she found out that her nephew had committed suicide sometime in [facility] past but she is just now finding out about it. Denies pain.  Suspicious and preoccupied. Voices no additional concerns at this.  Principal Problem: Bipolar I disorder, most recent episode manic, severe with psychotic features (HCC) Diagnosis:   [name] Active Problem List   Diagnosis Date Noted  . Bipolar I disorder, most recent episode manic, severe with psychotic features (HCC) [F31.2] 04/16/2016  . Tobacco use disorder [F17.200] 04/16/2016  . Diabetes mellitus without complication (HCC) [E11.9] 04/15/2016  . Kidney disease [N28.9] 04/15/2016  . COPD (chronic obstructive pulmonary disease) (HCC) [J44.9] 04/15/2016  . Hypertension [I10] 04/15/2016  . Coronary artery disease [I25.10] 04/15/2016   Total Time spent with [name]: 20 minutes  Past Psychiatric History: Bipolar disorder.  Past Medical History:  Past Medical History:  Diagnosis Date  . COPD (chronic obstructive pulmonary disease) (HCC)   . Diabetes mellitus without complication (HCC)   . GERD (gastroesophageal reflux disease)   . Kidney disease    History reviewed. No pertinent surgical history. Family History: History reviewed. No pertinent family history. Family Psychiatric  History: See H&P. Social History:  History  Alcohol Use No     History  Drug Use No  Social History   Social History  . Marital status: Divorced    Spouse name: N/A  . Number of children: N/A  . Years of education: N/A   Social History Main Topics  . Smoking status: Current Every Day Smoker    Packs/day: 0.50    Years: 40.00    Types: Cigarettes  . Smokeless tobacco: Current  User    Types: Snuff     Comment: not interested in quitting  . Alcohol use No  . Drug use: No  . Sexual activity: Not Currently    Birth control/ protection: None   Other Topics Concern  . None   Social History Narrative  . None   Additional Social History:                         Sleep: Poor  Appetite:  Fair  Current Medications: Current Facility-Administered Medications  Medication Dose Route Frequency Provider Last Rate Last Dose  . acetaminophen (TYLENOL) tablet 650 mg  650 mg Oral Q6H PRN Audery AmelJohn T Clapacs, MD      . alum & mag hydroxide-simeth (MAALOX/MYLANTA) 200-200-20 MG/5ML suspension 30 mL  30 mL Oral Q4H PRN Audery AmelJohn T Clapacs, MD      . Melene Muller[START ON 11/09/2016] aspirin chewable tablet 81 mg  81 mg Oral Daily Jimmy FootmanAndrea Hernandez-Gonzalez, MD      . bacitracin ointment   Topical PRN Audery AmelJohn T Clapacs, MD   1 application at 11/08/16 0908  . cilostazol (PLETAL) tablet 50 mg  50 mg Oral BID Audery AmelJohn T Clapacs, MD   50 mg at 11/07/16 0854  . diltiazem (CARDIZEM CD) 24 hr capsule 180 mg  180 mg Oral Daily Audery AmelJohn T Clapacs, MD   180 mg at 11/08/16 0907  . enalapril (VASOTEC) tablet 2.5 mg  2.5 mg Oral Daily Audery AmelJohn T Clapacs, MD   2.5 mg at 11/08/16 0905  . feeding supplement (GLUCERNA SHAKE) (GLUCERNA SHAKE) liquid 237 mL  237 mL Oral BID BM Jimmy FootmanAndrea Hernandez-Gonzalez, MD   237 mL at 11/08/16 1048  . insulin aspart (novoLOG) injection 0-9 Units  0-9 Units Subcutaneous TID WC John T Clapacs, MD      . magnesium hydroxide (MILK OF MAGNESIA) suspension 30 mL  30 mL Oral Daily PRN Audery AmelJohn T Clapacs, MD      . perphenazine (TRILAFON) tablet 4 mg  4 mg Oral BID Audery AmelJohn T Clapacs, MD   4 mg at 11/08/16 0907  . rosuvastatin (CRESTOR) tablet 5 mg  5 mg Oral Daily Audery AmelJohn T Clapacs, MD   5 mg at 11/08/16 0906  . temazepam (RESTORIL) capsule 30 mg  30 mg Oral QHS Jolanta B Pucilowska, MD   30 mg at 11/05/16 2135    Lab Results:  Results for orders placed or performed during [facility] hospital encounter of  11/04/16 (from Deanna past 48 hour(s))  Glucose, capillary     Status: Abnormal   Collection Time: 11/06/16 12:02 PM  Result Value Ref Range   Glucose-Capillary 117 (H) 65 - 99 mg/dL  Glucose, capillary     Status: Abnormal   Collection Time: 11/06/16  5:15 PM  Result Value Ref Range   Glucose-Capillary 120 (H) 65 - 99 mg/dL  Glucose, capillary     Status: Abnormal   Collection Time: 11/07/16  6:28 AM  Result Value Ref Range   Glucose-Capillary 125 (H) 65 - 99 mg/dL   Comment 1 Notify RN   Glucose, capillary  Status: Abnormal   Collection Time: 11/07/16 11:38 AM  Result Value Ref Range   Glucose-Capillary 118 (H) 65 - 99 mg/dL  Glucose, capillary     Status: Abnormal   Collection Time: 11/07/16  4:19 PM  Result Value Ref Range   Glucose-Capillary 132 (H) 65 - 99 mg/dL  Glucose, capillary     Status: Abnormal   Collection Time: 11/08/16  6:37 AM  Result Value Ref Range   Glucose-Capillary 150 (H) 65 - 99 mg/dL    Blood Alcohol level:  Lab Results  Component Value Date   ETH <5 04/15/2016    Metabolic Disorder Labs: Lab Results  Component Value Date   HGBA1C 5.9 (H) 11/04/2016   MPG 123 11/04/2016   MPG 117 11/02/2016   Lab Results  Component Value Date   PROLACTIN 19.3 11/04/2016   PROLACTIN 15.7 04/18/2016   Lab Results  Component Value Date   CHOL 229 (H) 11/04/2016   TRIG 75 11/04/2016   HDL 57 11/04/2016   CHOLHDL 4.0 11/04/2016   VLDL 15 11/04/2016   LDLCALC 157 (H) 11/04/2016   LDLCALC 65 04/18/2016    Physical Findings: AIMS:  , ,  ,  ,    CIWA:    COWS:     Musculoskeletal: Strength & Muscle Tone: within normal limits Gait & Station: normal [name] leans: N/A  Psychiatric Specialty Exam: Physical Exam  Nursing note and vitals reviewed. Constitutional: She is oriented to person, place, and time. She appears well-developed and well-nourished.  HENT:  Head: Normocephalic and atraumatic.  Eyes: Conjunctivae and EOM are normal.  Neck: Normal  range of motion.  Respiratory: Effort normal.  Musculoskeletal: Normal range of motion.  Neurological: She is alert and oriented to person, place, and time.    Review of Systems  Gastrointestinal: Positive for abdominal pain.  Musculoskeletal: Positive for back pain.  Psychiatric/Behavioral: Negative for hallucinations. Deanna [name] does not have insomnia.   All other systems reviewed and are negative.   Blood pressure (!) 114/48, pulse 65, temperature 97.9 F (36.6 C), resp. rate 18, height 5\' 4"  (1.626 m), weight 49.9 kg (110 lb), SpO2 98 %.Body mass index is 18.88 kg/m.  General Appearance: Casual  Eye Contact:  Good  Speech:  Clear and Coherent  Volume:  Normal  Mood:  Angry, Dysphoric and Irritable  Affect:  Congruent  Thought Process:  Disorganized and Descriptions of Associations: Tangential  Orientation:  Full (Time, Place, and Person)  Thought Content:  Delusions and Paranoid Ideation  Suicidal Thoughts:  No  Homicidal Thoughts:  No  Memory:  Immediate;   Fair Recent;   Fair Remote;   Fair  Judgement:  Impaired  Insight:  Lacking  Psychomotor Activity:  Normal  Concentration:  Concentration: Fair and Attention Span: Fair  Recall:  [employer] of Knowledge:  Fair  Language:  Fair  Akathisia:  No  Handed:  Right  AIMS (if indicated):     Assets:  Communication Skills Desire for Improvement Financial Resources/Insurance Physical Health Resilience  ADL's:  Intact  Cognition:  WNL  Sleep:  Number of Hours: 6     Treatment Plan Summary: Daily contact with [name] to assess and evaluate symptoms and progress in treatment and Medication management   Deanna [name] is a 65 year old female with a history of bipolar disorder admitted in a manic psychotic episode in [facility] context of treatment noncompliance.  1. Psychosis. She was restarted on perphenazine. She refused a mood stabilizer.   2.  Insomnia. She slept well with higher dose of Restoril.    3. Smoking.  Nicotine patch was available.  4. Hypertension. She is on Cardizem and enalapril.  5. Coronary artery disease. She is on Plavix, Crestor, and Pletal.  6. Diabetes. She is taking to Trulicity at home and refuses insulin in [facility] hospital. She is on sliding scale insulin, blood glucose monitoring, ADA diet.  7. COPD. She is on inhaler.  8. Metabolic syndrome monitoring. Labs completed during previous admission.   9. Disposition. She is being evicted but can return to her appartment until 12/7. She will follow up with [facility].    11/18 I will order Glucerna checks. She requested to have [facility] Plavix discontinued. She says that she had discontinued this medication and instead she wants aspirin. Says she is bruising too much with Deanna Plavix. She is not willing to continue Deanna Plavix. No other changes in medications were made.  Jimmy Footman, MD 11/08/2016, 11:07 AM

## 2016-11-08 NOTE — Progress Notes (Signed)
Patient irritable, angry, flat in affect and mood. She was paranoid especially during medication administration. She had questions about why her medications were different colors and shapes from what she is used to. Medication education provided/done but patient not receptive. Patient refused Insulin administration and stated that she takes only Trulicity. Patient also refused Pletal 50mg . She stated, "I don't need two blood thinners." MD Dr. Ardyth HarpsHernandez notified. Patient attended group briefly, was seen in the dayroom but interacted only minimally with peers. Encouraged to verbalize feelings to nursing staff, she verbalized understanding.

## 2016-11-08 NOTE — Plan of Care (Signed)
Problem: Health Behavior/Discharge Planning: Goal: Compliance with prescribed medication regimen will improve Outcome: Not Progressing Patient refused Insulin and Pletal this morning. Is paranoid during medication administration questioning her medications(the color, shape). Medication education done but pt. Not receptive.

## 2016-11-08 NOTE — [facility] Group Notes (Addendum)
[facility] LCSW Group Therapy  11/08/2016 3:20 PM  Type of Therapy:  Group Therapy  Participation Level:  Active  Participation Quality:  Appropriate  Affect:  Appropriate  Cognitive:  Alert  Insight:  Improving  Engagement in Therapy:  Engaged  Modes of Intervention:  Activity, Discussion, Education and Support  Summary of Progress/Problems:Self esteem: Patients discussed self esteem and how it impacts them. They discussed what aspects in their lives has influenced their self esteem. They were challenged to identify changes that are needed in order to improve self esteem. Patients participated in activity where they had to identify positive adjectives they felt described their personality. Patients shared with the group on the following areas: Things I am good at, What I like about my appearance, I've helped others by, What I value the most, compliments I have received, challenges I have overcome, thing that make me unique, and Times I've made others happy. Patients also participated in positive socialization in group game of "Jenga". Patient was appropriate during group and interacted with her peers positively.   Deloy Archey G. Garnette CzechSampson MSW, LCSWA 11/08/2016, 3:25 PM

## 2016-11-09 LAB — GLUCOSE, CAPILLARY
Glucose-Capillary: 148 mg/dL — ABNORMAL HIGH (ref 65–99)
Glucose-Capillary: 95 mg/dL (ref 65–99)
Glucose-Capillary: 90 mg/dL (ref 65–99)
Glucose-Capillary: 118 mg/dL — ABNORMAL HIGH (ref 65–99)
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
## 2016-11-09 NOTE — Plan of Care (Signed)
Problem: Role Relationship: Goal: Ability to interact with others will improve Outcome: Progressing Patient in dayroom socializing with peers

## 2016-11-09 NOTE — Progress Notes (Signed)
[facility] MD Progress Note  11/09/2016 1:53 PM Deanna [name]  MRN:  [id number]  Subjective:  Deanna [name] has a history of bipolar illness. She was hospitalized here for manic episode in May 2017. After discharge she did not follow-up with a psychiatrist and has been off medications ever since. She was brought to the hospital for agitated, paranoid, disorganized behavior. As she was getting worse she made a mess of her finances and currently has no access to her bank account. She also is being evicted from her apartment on December 7. She was interviewed on 11/16 by [facility] and accepted to their service. The patient was doing well as long as she stayed with Dr. Johny Drilling who has retired a year or so ago. She would not accept medications other than Trilafon. It works for her when compliant.  11/17 Deanna [name] is still very disorganized and paranoid. She still refuses any additional medications except for Trilafon. She denies any symptoms of depression, anxiety or psychosis and demands discharge. Tolerates Trilafon well. Good group participation.    11/18 patient says that yesterday she received a phone call from a family member. She received bad news as her nephew committed suicide by heroin overdose. Patient says she was in shock when she received the news. Her other than that she feels more optimistic. She plans to attend NA meetings after discharge. She reports his sleeping very well. She denies problems with appetite, energy or concentration. She denies suicidality, homicidality or having auditory or visual hallucinations.  Patient states that the nurse was trying to give her Seroquel last night but she refuses because she had had an adverse reaction to Seroquel (per nursing not look like he was Restoril) in the past. I'm looking her records but I do not see an order for Seroquel. I assured her that this medication is not ordered for today. She also complained of having some left-sided  abdominal pain of mild intensity. She thinks it might be from eating too much. She requested Glucerna shakes  11/19 patient has been refusing medications on and off. She is willing to take aspirin but requested yesterday to have the Plavix discontinued. She also has been refusing pletal. He at times refuses sliding scale insulin. She is pleasant and cooperative during assessment. She tells me her nephew has said he is going to wire her some money to help her by a car. She says she is eager to get out of the hospital because she has to move out of her apartment by December 3. She denies depressed mood, or any depressive symptoms, denies problems with his sleep, appetite or energy. She denies suicidality, homicidality or auditory or visual hallucinations. As far as physical complaints the patient states she had some swelling on her legs. She requests renal diet.  Per nursing: Patient irritable this angry. Reports she just wants to get out of here, "there is nothing wrong with me" Pt denies SI, HI, AVH. Medication compliant. Encouragement and support offered. Pt receptive and remains safe on unit with q 15 min checks.  Principal Problem: Bipolar I disorder, most recent episode manic, severe with psychotic features (HCC) Diagnosis:   Patient Active Problem List   Diagnosis Date Noted  . Bipolar I disorder, most recent episode manic, severe with psychotic features (HCC) [F31.2] 04/16/2016  . Tobacco use disorder [F17.200] 04/16/2016  . Diabetes mellitus without complication (HCC) [E11.9] 04/15/2016  . Kidney disease [N28.9] 04/15/2016  . COPD (chronic obstructive pulmonary disease) (HCC) Kastor.Reeks.9]  04/15/2016  . Hypertension [I10] 04/15/2016  . Coronary artery disease [I25.10] 04/15/2016   Total Time spent with patient: 20 minutes  Past Psychiatric History: Bipolar disorder.  Past Medical History:  Past Medical History:  Diagnosis Date  . COPD (chronic obstructive pulmonary disease) (HCC)   .  Diabetes mellitus without complication (HCC)   . GERD (gastroesophageal reflux disease)   . Kidney disease    History reviewed. No pertinent surgical history. Family History: History reviewed. No pertinent family history. Family Psychiatric  History: See H&P. Social History:  History  Alcohol Use No     History  Drug Use No    Social History   Social History  . Marital status: Divorced    Spouse name: N/A  . Number of children: N/A  . Years of education: N/A   Social History Main Topics  . Smoking status: Current Every Day Smoker    Packs/day: 0.50    Years: 40.00    Types: Cigarettes  . Smokeless tobacco: Current User    Types: Snuff     Comment: not interested in quitting  . Alcohol use No  . Drug use: No  . Sexual activity: Not Currently    Birth control/ protection: None   Other Topics Concern  . None   Social History Narrative  . None    Current Medications: Current Facility-Administered Medications  Medication Dose Route Frequency Provider Last Rate Last Dose  . acetaminophen (TYLENOL) tablet 650 mg  650 mg Oral Q6H PRN Audery AmelJohn T Clapacs, MD      . alum & mag hydroxide-simeth (MAALOX/MYLANTA) 200-200-20 MG/5ML suspension 30 mL  30 mL Oral Q4H PRN Audery AmelJohn T Clapacs, MD      . aspirin chewable tablet 81 mg  81 mg Oral Daily Jimmy FootmanAndrea Hernandez-Gonzalez, MD   81 mg at 11/09/16 0801  . bacitracin ointment   Topical PRN Audery AmelJohn T Clapacs, MD   1 application at 11/08/16 [phone]  . cilostazol (PLETAL) tablet 50 mg  50 mg Oral BID Audery AmelJohn T Clapacs, MD   50 mg at 11/09/16 0802  . diltiazem (CARDIZEM CD) 24 hr capsule 180 mg  180 mg Oral Daily Audery AmelJohn T Clapacs, MD   180 mg at 11/09/16 0801  . enalapril (VASOTEC) tablet 2.5 mg  2.5 mg Oral Daily Audery AmelJohn T Clapacs, MD   2.5 mg at 11/09/16 0802  . feeding supplement (GLUCERNA SHAKE) (GLUCERNA SHAKE) liquid 237 mL  237 mL Oral BID BM Jimmy FootmanAndrea Hernandez-Gonzalez, MD   237 mL at 11/09/16 1000  . insulin aspart (novoLOG) injection 0-9 Units  0-9  Units Subcutaneous TID WC John T Clapacs, MD      . magnesium hydroxide (MILK OF MAGNESIA) suspension 30 mL  30 mL Oral Daily PRN Audery AmelJohn T Clapacs, MD      . perphenazine (TRILAFON) tablet 4 mg  4 mg Oral BID Audery AmelJohn T Clapacs, MD   4 mg at 11/09/16 0802  . rosuvastatin (CRESTOR) tablet 5 mg  5 mg Oral Daily Audery AmelJohn T Clapacs, MD   5 mg at 11/09/16 0801  . temazepam (RESTORIL) capsule 30 mg  30 mg Oral QHS Shari ProwsJolanta B Pucilowska, MD   30 mg at 11/08/16 2136    Lab Results:  Results for orders placed or performed during the hospital encounter of 11/04/16 (from the past 48 hour(s))  Glucose, capillary     Status: Abnormal   Collection Time: 11/07/16  4:19 PM  Result Value Ref Range   Glucose-Capillary 132 (H) 65 -  99 mg/dL  Glucose, capillary     Status: Abnormal   Collection Time: 11/08/16  6:37 AM  Result Value Ref Range   Glucose-Capillary 150 (H) 65 - 99 mg/dL  Glucose, capillary     Status: Abnormal   Collection Time: 11/08/16 12:14 PM  Result Value Ref Range   Glucose-Capillary 129 (H) 65 - 99 mg/dL   Comment 1 Notify RN    Comment 2 Document in Chart   Glucose, capillary     Status: Abnormal   Collection Time: 11/08/16  4:32 PM  Result Value Ref Range   Glucose-Capillary 132 (H) 65 - 99 mg/dL   Comment 1 Notify RN    Comment 2 Document in Chart   Glucose, capillary     Status: Abnormal   Collection Time: 11/08/16  9:39 PM  Result Value Ref Range   Glucose-Capillary 185 (H) 65 - 99 mg/dL  Glucose, capillary     Status: Abnormal   Collection Time: 11/09/16  6:01 AM  Result Value Ref Range   Glucose-Capillary 148 (H) 65 - 99 mg/dL  Glucose, capillary     Status: None   Collection Time: 11/09/16 12:04 PM  Result Value Ref Range   Glucose-Capillary 95 65 - 99 mg/dL    Blood Alcohol level:  Lab Results  Component Value Date   ETH <5 04/15/2016    Metabolic Disorder Labs: Lab Results  Component Value Date   HGBA1C 5.9 (H) 11/04/2016   MPG 123 11/04/2016   MPG 117 11/02/2016    Lab Results  Component Value Date   PROLACTIN 19.3 11/04/2016   PROLACTIN 15.7 04/18/2016   Lab Results  Component Value Date   CHOL 229 (H) 11/04/2016   TRIG 75 11/04/2016   HDL 57 11/04/2016   CHOLHDL 4.0 11/04/2016   VLDL 15 11/04/2016   LDLCALC 157 (H) 11/04/2016   LDLCALC 65 04/18/2016    Physical Findings: AIMS:  , ,  ,  ,    CIWA:    COWS:     Musculoskeletal: Strength & Muscle Tone: within normal limits Gait & Station: normal Patient leans: N/A  Psychiatric Specialty Exam: Physical Exam  Nursing note and vitals reviewed. Constitutional: She is oriented to person, place, and time. She appears well-developed and well-nourished.  HENT:  Head: Normocephalic and atraumatic.  Eyes: Conjunctivae and EOM are normal.  Neck: Normal range of motion.  Respiratory: Effort normal.  Musculoskeletal: Normal range of motion.  Neurological: She is alert and oriented to person, place, and time.    Review of Systems  Gastrointestinal: Positive for abdominal pain.  Musculoskeletal: Positive for back pain.  Psychiatric/Behavioral: Negative for hallucinations. The patient does not have insomnia.   All other systems reviewed and are negative.   Blood pressure (!) 110/48, pulse 65, temperature 98.1 F (36.7 C), temperature source Oral, resp. rate 18, height 5\' 4"  (1.626 m), weight 49.9 kg (110 lb), SpO2 98 %.Body mass index is 18.88 kg/m.  General Appearance: Casual  Eye Contact:  Good  Speech:  Clear and Coherent  Volume:  Normal  Mood:  Angry, Dysphoric and Irritable  Affect:  Congruent  Thought Process:  Disorganized and Descriptions of Associations: Tangential  Orientation:  Full (Time, Place, and Person)  Thought Content:  Delusions and Paranoid Ideation  Suicidal Thoughts:  No  Homicidal Thoughts:  No  Memory:  Immediate;   Fair Recent;   Fair Remote;   Fair  Judgement:  Impaired  Insight:  Lacking  Psychomotor Activity:  Normal  Concentration:  Concentration:  Fair and Attention Span: Fair  Recall:  F[employer]Fair  Fund of Knowledge:  Fair  Language:  Fair  Akathisia:  No  Handed:  Right  AIMS (if indicated):     Assets:  Communication Skills Desire for Improvement Financial Resources/Insurance Physical Health Resilience  ADL's:  Intact  Cognition:  WNL  Sleep:  Number of Hours: 6.45     Treatment Plan Summary: Daily contact with patient to assess and evaluate symptoms and progress in treatment and Medication management   Deanna [name] is a 65 year old female with a history of bipolar disorder admitted in a manic psychotic episode in the context of treatment noncompliance.  1. Psychosis. She was restarted on perphenazine. She refused a mood stabilizer.   2. Insomnia. She slept well with higher dose of Restoril.    3. Smoking. Nicotine patch was available.  4. Hypertension. She is on Cardizem and enalapril.  5. Coronary artery disease. She is on Plavix, Crestor, and Pletal.  6. Diabetes. She is taking to Trulicity at home and refuses insulin in the hospital. She is on sliding scale insulin, blood glucose monitoring, ADA diet.  7. COPD. She is on inhaler.  8. Metabolic syndrome monitoring. Labs completed during previous admission.   9. Disposition. She is being evicted but can return to her appartment until 12/7. [facility]    11/18 I will order Glucerna checks. She requested to have the Plavix discontinued. She says that she had discontinued this medication and instead she wants aspirin. Says she is bruising too much with the Plavix. She is not willing to continue the Plavix. No other changes in medications were made.  11/19 per nurse's notes the patient has been is sleeping well. She is intermittently noncompliant with her nonpsychiatric medications. Refusing sliding scale insulin, plavix and pletal. Patient has not been disruptive in the unit. For lower extremity edema the patient has been  advised to elevate her legs. We have ordered a low sodium diet.  Jimmy FootmanHernandez-Gonzalez,  Deanna Pond, MD 11/09/2016, 1:53 PM

## 2016-11-09 NOTE — Progress Notes (Signed)
Pt alert and oriented x3, respirations even and unlabored, gait steady and unassisted, no acute distress noted. Denies SI/HI/AVH but does express feeling depressed and anxious of 5/10 because "I just found out that my oldest nephew committed suicide. He overdosed on Heroin." Pt also c/o both ankles and feet being swollen. Upon assessment, +2 pitting edema was noted on pts ankles and feet bilaterally. This writer raised the foot of pts bed and placed a folded blanket under pts feet in a attempt to elevate legs. Denies pain. Pt has requested to see the doctor in the morning to discuss this with her. This issue will also be passed on in shift-to-shift report. No further complaints. Is medication compliant. Remains on q15 minute observation rounds for safety. Will continue to monitor.

## 2016-11-09 NOTE — [facility] Group Notes (Signed)
[facility] LCSW Group Therapy  11/09/2016 12:21 PM  Type of Therapy:  Group Therapy  Participation Level:  Active  Participation Quality:  Attentive and Sharing  Affect:  Appropriate  Cognitive:  Alert  Insight:  Improving  Engagement in Therapy:  Improving and Limited  Modes of Intervention:  Activity, Discussion, Education, Dance movement psychotherapisteality Testing, Socialization and Support  Summary of Progress/Problems: Safety Planning: Patients identified fears or worries surrounding discharge. Patients offered support to their peers and openly developed safety plans for their individual needs. Patients developed their own safety plan. Patients discussed their warning signs, coping strategies, support system with family and friends, identified mental health professionals, and how to keep their environments safe (ex. Removing unnecessary medications or removing weapons/guns). Patients then discussed their personalized safety plan with the group. Patient discussed her worries of becoming homelessness openly with the group. CSW and peers provided support to patient and discussed resources she can utilize when she discharges.   Maximus Hoffert G. Garnette CzechSampson MSW, LCSWA 11/09/2016, 12:28 PM

## 2016-11-09 NOTE — Progress Notes (Signed)
Patient irritable this angry. Reports she just wants to get out of here, "there is nothing wrong with me" Pt denies SI, HI, AVH. Medication compliant. Encouragement and support offered. Pt receptive and remains safe on unit with q 15 min checks.

## 2016-11-09 NOTE — [facility] Group Notes (Signed)
[facility] Group Notes:  (Nursing/MHT/Case Management/Adjunct)  Date:  11/09/2016  Time:  9:37 PM  Type of Therapy:  Group Therapy  Participation Level:  Active  Participation Quality:  Resistant  Affect:  Defensive  Cognitive:  Alert  Insight:  Lacking  Engagement in Group:  Defensive and Engaged  Modes of Intervention:  Discussion  Summary of Progress/Problems: Pt stated that her goal is to get discharged. Staff made pt aware she had to speak with her doctor and Child psychotherapistsocial worker. Pt begin to get defensive and state that they were trying to make her become homeless and needed to be discharged before Tuesday so her nephew could help her.   [name] [name] 11/09/2016, 9:37 PM

## 2016-11-10 DIAGNOSIS — Z23 Encounter for immunization: Secondary | ICD-10-CM | POA: Diagnosis not present

## 2016-11-10 LAB — GLUCOSE, CAPILLARY
Glucose-Capillary: 138 mg/dL — ABNORMAL HIGH (ref 65–99)
Glucose-Capillary: 137 mg/dL — ABNORMAL HIGH (ref 65–99)

## 2016-11-10 MED ORDER — CLOPIDOGREL BISULFATE 75 MG PO TABS: 75.0000 mg | ORAL_TABLET | Freq: Every day | ORAL | 1 refills | Status: DC

## 2016-11-10 MED ORDER — PERPHENAZINE 4 MG PO TABS: 4.0000 mg | ORAL_TABLET | Freq: Two times a day (BID) | ORAL | 3 refills | Status: DC

## 2016-11-10 MED ORDER — TEMAZEPAM 30 MG PO CAPS: 30.0000 mg | ORAL_CAPSULE | Freq: Every day | ORAL | 0 refills | Status: AC

## 2016-11-10 MED ORDER — TEMAZEPAM 30 MG PO CAPS: 30.0000 mg | ORAL_CAPSULE | Freq: Every day | ORAL | 0 refills | Status: DC

## 2016-11-10 MED ORDER — DILTIAZEM HCL ER COATED BEADS 180 MG PO CP24: 180.0000 mg | ORAL_CAPSULE | Freq: Every day | ORAL | 1 refills | Status: DC

## 2016-11-10 MED ORDER — CILOSTAZOL 50 MG PO TABS
50.0000 mg | ORAL_TABLET | Freq: Two times a day (BID) | ORAL | 1 refills | Status: DC
Start: 2016-11-10 — End: 2016-11-10

## 2016-11-10 MED ORDER — ENALAPRIL MALEATE 2.5 MG PO TABS: 2.5000 mg | ORAL_TABLET | Freq: Every day | ORAL | 1 refills | Status: AC

## 2016-11-10 MED ORDER — PERPHENAZINE 4 MG PO TABS: 4.0000 mg | ORAL_TABLET | Freq: Two times a day (BID) | ORAL | 3 refills | Status: AC

## 2016-11-10 MED ORDER — ROSUVASTATIN CALCIUM 5 MG PO TABS: 5.0000 mg | ORAL_TABLET | Freq: Every day | ORAL | 1 refills | Status: DC

## 2016-11-10 MED ORDER — ENALAPRIL MALEATE 2.5 MG PO TABS: 2.5000 mg | ORAL_TABLET | Freq: Every day | ORAL | 1 refills | Status: DC

## 2016-11-10 MED ORDER — CILOSTAZOL 50 MG PO TABS: 50.0000 mg | ORAL_TABLET | Freq: Two times a day (BID) | ORAL | 1 refills | Status: AC

## 2016-11-10 MED ORDER — CLOPIDOGREL BISULFATE 75 MG PO TABS: 75.0000 mg | ORAL_TABLET | Freq: Every day | ORAL | 1 refills | Status: AC

## 2016-11-10 MED ORDER — DILTIAZEM HCL ER COATED BEADS 180 MG PO CP24: 180.0000 mg | ORAL_CAPSULE | Freq: Every day | ORAL | 1 refills | Status: AC

## 2016-11-10 MED ORDER — CLOPIDOGREL BISULFATE 75 MG PO TABS
75.0000 mg | ORAL_TABLET | Freq: Every day | ORAL | Status: DC
  Filled 2016-11-10: qty 7

## 2016-11-10 MED ORDER — ROSUVASTATIN CALCIUM 5 MG PO TABS: 5.0000 mg | ORAL_TABLET | Freq: Every day | ORAL | 1 refills | Status: AC

## 2016-11-10 NOTE — Tx Team (Signed)
Interdisciplinary Treatment and Diagnostic Plan Update  11/10/2016 Time of Session: 10:30am Deanna [name] MRN: [id number]  Principal Diagnosis: Bipolar I disorder, most recent episode manic, severe with psychotic features (HCC)  Secondary Diagnoses: Principal Problem:   Bipolar I disorder, most recent episode manic, severe with psychotic features (HCC) Active Problems:   Diabetes mellitus without complication (HCC)   COPD (chronic obstructive pulmonary disease) (HCC)   Hypertension   Coronary artery disease   Tobacco use disorder   Current Medications:  Current Facility-Administered Medications  Medication Dose Route Frequency Provider Last Rate Last Dose  . acetaminophen (TYLENOL) tablet 650 mg  650 mg Oral Q6H PRN Audery AmelJohn T Clapacs, MD      . alum & mag hydroxide-simeth (MAALOX/MYLANTA) 200-200-20 MG/5ML suspension 30 mL  30 mL Oral Q4H PRN Audery AmelJohn T Clapacs, MD      . aspirin chewable tablet 81 mg  81 mg Oral Daily Jimmy FootmanAndrea Hernandez-Gonzalez, MD   81 mg at 11/10/16 0846  . bacitracin ointment   Topical PRN Audery AmelJohn T Clapacs, MD   1 application at 11/08/16 [phone]  . cilostazol (PLETAL) tablet 50 mg  50 mg Oral BID Audery AmelJohn T Clapacs, MD   50 mg at 11/10/16 0847  . diltiazem (CARDIZEM CD) 24 hr capsule 180 mg  180 mg Oral Daily Audery AmelJohn T Clapacs, MD   180 mg at 11/10/16 0847  . enalapril (VASOTEC) tablet 2.5 mg  2.5 mg Oral Daily Audery AmelJohn T Clapacs, MD   2.5 mg at 11/10/16 0846  . feeding supplement (GLUCERNA SHAKE) (GLUCERNA SHAKE) liquid 237 mL  237 mL Oral BID BM Jimmy FootmanAndrea Hernandez-Gonzalez, MD   237 mL at 11/10/16 1000  . insulin aspart (novoLOG) injection 0-9 Units  0-9 Units Subcutaneous TID WC John T Clapacs, MD      . magnesium hydroxide (MILK OF MAGNESIA) suspension 30 mL  30 mL Oral Daily PRN Audery AmelJohn T Clapacs, MD      . perphenazine (TRILAFON) tablet 4 mg  4 mg Oral BID Audery AmelJohn T Clapacs, MD   4 mg at 11/10/16 0846  . rosuvastatin (CRESTOR) tablet 5 mg  5 mg Oral Daily Audery AmelJohn T Clapacs, MD   5 mg at  11/10/16 0846  . temazepam (RESTORIL) capsule 30 mg  30 mg Oral QHS Shari ProwsJolanta B Pucilowska, MD   30 mg at 11/08/16 2136   Current Outpatient Prescriptions  Medication Sig Dispense Refill  . cilostazol (PLETAL) 50 MG tablet Take 1 tablet (50 mg total) by mouth 2 (two) times daily. 60 tablet 1  . clopidogrel (PLAVIX) 75 MG tablet Take 1 tablet (75 mg total) by mouth daily. 30 tablet 1  . diltiazem (CARDIZEM CD) 180 MG 24 hr capsule Take 1 capsule (180 mg total) by mouth daily. 30 capsule 1  . enalapril (VASOTEC) 2.5 MG tablet Take 1 tablet (2.5 mg total) by mouth daily. 30 tablet 1  . perphenazine (TRILAFON) 4 MG tablet Take 1 tablet (4 mg total) by mouth 2 (two) times daily. 60 tablet 3  . rosuvastatin (CRESTOR) 5 MG tablet Take 1 tablet (5 mg total) by mouth daily. 30 tablet 1  . temazepam (RESTORIL) 30 MG capsule Take 1 capsule (30 mg total) by mouth at bedtime. 30 capsule 0  . TRULICITY 0.75 MG/0.5ML SOPN once a week.  2   PTA Medications: No prescriptions prior to admission.    Patient Stressors: Financial difficulties Health problems Loss of housing  Patient Strengths: Active sense of humor Communication skills General fund of knowledge  Treatment Modalities: Medication Management, Group therapy, Case management,  1 to 1 session with clinician, Psychoeducation, Recreational therapy.   Physician Treatment Plan for Primary Diagnosis: Bipolar I disorder, most recent episode manic, severe with psychotic features (HCC) Long Term Goal(s): Improvement in symptoms so as ready for discharge NA   Short Term Goals: Ability to identify changes in lifestyle to reduce recurrence of condition will improve Ability to verbalize feelings will improve Ability to disclose and discuss suicidal ideas Ability to demonstrate self-control will improve Ability to identify and develop effective coping behaviors will improve Ability to maintain clinical measurements within normal limits will  improve Compliance with prescribed medications will improve NA  Medication Management: Evaluate patient's response, side effects, and tolerance of medication regimen.  Therapeutic Interventions: 1 to 1 sessions, Unit Group sessions and Medication administration.  Evaluation of Outcomes: Adequate for discharge  Physician Treatment Plan for Secondary Diagnosis: Principal Problem:   Bipolar I disorder, most recent episode manic, severe with psychotic features (HCC) Active Problems:   Diabetes mellitus without complication (HCC)   COPD (chronic obstructive pulmonary disease) (HCC)   Hypertension   Coronary artery disease   Tobacco use disorder  Long Term Goal(s): Improvement in symptoms so as ready for discharge NA   Short Term Goals: Ability to identify changes in lifestyle to reduce recurrence of condition will improve Ability to verbalize feelings will improve Ability to disclose and discuss suicidal ideas Ability to demonstrate self-control will improve Ability to identify and develop effective coping behaviors will improve Ability to maintain clinical measurements within normal limits will improve Compliance with prescribed medications will improve NA     Medication Management: Evaluate patient's response, side effects, and tolerance of medication regimen.  Therapeutic Interventions: 1 to 1 sessions, Unit Group sessions and Medication administration.  Evaluation of Outcomes: Adequate for discharge   RN Treatment Plan for Primary Diagnosis: Bipolar I disorder, most recent episode manic, severe with psychotic features (HCC) Long Term Goal(s): Knowledge of disease and therapeutic regimen to maintain health will improve  Short Term Goals: Ability to remain free from injury will improve, Ability to verbalize frustration and anger appropriately will improve, Ability to demonstrate self-control, Ability to participate in decision making will improve, Ability to verbalize feelings  will improve and Ability to disclose and discuss suicidal ideas  Medication Management: RN will administer medications as ordered by provider, will assess and evaluate patient's response and provide education to patient for prescribed medication. RN will report any adverse and/or side effects to prescribing provider.  Therapeutic Interventions: 1 on 1 counseling sessions, Psychoeducation, Medication administration, Evaluate responses to treatment, Monitor vital signs and CBGs as ordered, Perform/monitor CIWA, COWS, AIMS and Fall Risk screenings as ordered, Perform wound care treatments as ordered.  Evaluation of Outcomes: Adequate for discharge   LCSW Treatment Plan for Primary Diagnosis: Bipolar I disorder, most recent episode manic, severe with psychotic features (HCC) Long Term Goal(s): Safe transition to appropriate next level of care at discharge, Engage patient in therapeutic group addressing interpersonal concerns.  Short Term Goals: Engage patient in aftercare planning with referrals and resources, Increase social support, Increase ability to appropriately verbalize feelings, Increase emotional regulation, Facilitate acceptance of mental health diagnosis and concerns and Increase skills for wellness and recovery  Therapeutic Interventions: Assess for all discharge needs, 1 to 1 time with Social worker, Explore available resources and support systems, Assess for adequacy in community support network, Educate family and significant other(s) on suicide prevention, Complete Psychosocial Assessment, Interpersonal group therapy.  Evaluation of Outcomes: Adequate for discharge   Progress in Treatment: Attending groups: Yes. Participating in groups: Yes. Taking medication as prescribed: Yes. Toleration medication: Yes. Family/Significant other contact made: No, will contact:  CSW Patient understands diagnosis: Yes. Discussing patient identified problems/goals with staff: Yes. Medical  problems stabilized or resolved: Yes. Denies suicidal/homicidal ideation: Yes. Issues/concerns per patient self-inventory: Yes. Other: none reported  New problem(s) identified: Yes, Describe:  None reported  New Short Term/Long Term Goal(s): Pt hopes o resolve housing worries due to upcoming eviction   Discharge Plan or Barriers: Pt will discharge home to [facility] to live and will follow up with [facility] team for medication management and therapy.  Reason for Continuation of Hospitalization: Anxiety Delusions  Depression Hallucinations  Estimated Length of Stay:  Attendees: Patient: Deanna [name] 11/06/2016 10:56 AM  Physician: Dr. Jennet Maduro, MD 11/06/2016 10:56 AM  Nursing: Deanna [name] 11/06/2016 10:56 AM  RN Care Manager: 11/06/2016 10:56 AM  Social Worker: Dorothe Pea. Cassadee Vanzandt, Theresia Majors, LCAS 11/06/2016 10:56 AM  Recreational Therapist: 11/06/2016 10:56 AM  Other:  11/06/2016 10:56 AM  Other:  11/06/2016 10:56 AM  Other: 11/06/2016 10:56 AM    Scribe for Treatment Team: Dorothe Pea Hiedi Touchton, LCSWA 11/06/2016  Updated 11/10/16 by Dorothe Pea. Clayton Bosserman, LCSWA, LCAS

## 2016-11-10 NOTE — [facility] Suicide Risk Assessment (Signed)
[facility]   Principal Problem: Bipolar I disorder, most recent episode manic, severe with psychotic features [facility](HCC) Discharge Diagnoses:  Patient Active Problem List   Diagnosis Date Noted  . Bipolar I disorder, most recent episode manic, severe with psychotic features (HCC) [F31.2] 04/16/2016  . Tobacco use disorder [F17.200] 04/16/2016  . Diabetes mellitus without complication (HCC) [E11.9] 04/15/2016  . Kidney disease [N28.9] 04/15/2016  . COPD (chronic obstructive pulmonary disease) (HCC) [J44.9] 04/15/2016  . Hypertension [I10] 04/15/2016  . Coronary artery disease [I25.10] 04/15/2016    Total Time spent with patient: 30 minutes  Musculoskeletal: Strength & Muscle Tone: within normal limits Gait & Station: normal Patient leans: N/A  Psychiatric Specialty Exam: Review of Systems  Psychiatric/Behavioral: The patient is nervous/anxious.   All other systems reviewed and are negative.   Blood pressure (!) 116/45, pulse 71, temperature 98.4 F (36.9 C), temperature source Oral, resp. rate 18, height 5\' 4"  (1.626 m), weight 49.9 kg (110 lb), SpO2 98 %.Body mass index is 18.88 kg/m.  General Appearance: Casual  Eye Contact::  Good  Speech:  Clear and Coherent409  Volume:  Normal  Mood:  Euthymic  Affect:  Appropriate  Thought Process:  Goal Directed and Descriptions of Associations: Tangential  Orientation:  Full (Time, Place, and Person)  Thought Content:  Delusions and Paranoid Ideation  Suicidal Thoughts:  No  Homicidal Thoughts:  No  Memory:  Immediate;   Fair Recent;   Fair Remote;   Fair  Judgement:  Impaired  Insight:  Shallow  Psychomotor Activity:  Normal  Concentration:  Fair  Recall:  [employer]Fair  Fund of Knowledge:Fair  Language: Fair  Akathisia:  No  Handed:  Right  AIMS (if indicated):     Assets:  Communication Skills Desire for Improvement Financial Resources/Insurance Housing Physical Health Resilience Social Support   Sleep:  Number of Hours: 6  Cognition: WNL  ADL's:  Intact   Mental Status Per Nursing Assessment::   On Admission:     Demographic Factors:  Divorced or widowed, Caucasian, Low socioeconomic status and Living alone  Loss Factors: Financial problems/change in socioeconomic status  Historical Factors: Family history of mental illness or substance abuse and Impulsivity  Risk Reduction Factors:   Sense of responsibility to family and Positive social support  Continued Clinical Symptoms:  Bipolar Disorder:   Mixed State  Cognitive Features That Contribute To Risk:  None    Suicide Risk:  Minimal: No identifiable suicidal ideation.  Patients presenting with no risk factors but with morbid ruminations; may be classified as minimal risk based on the severity of the depressive symptoms    Plan Of Care/Follow-up recommendations:  Activity:  As tolerated. Diet:  Low sodium heart healthy ADA diet. Other:  Keep follow-up appointments.  Kristine LineaJolanta Ellington Greenslade, MD 11/10/2016, 10:52 AM

## 2016-11-10 NOTE — [facility] Suicide Risk Assessment (Signed)
[facility] INPATIENT:  Family/Significant Other Suicide Prevention Education  Suicide Prevention Education:  Patient Refusal for Family/Significant Other Suicide Prevention Education: The patient Deanna [name] has refused to provide written consent for family/significant other to be provided Family/Significant Other Suicide Prevention Education during admission and/or prior to discharge.  Physician notified.  CSW completed SPE with the pt.    Deanna [name] 11/10/2016, 3:40 PM

## 2016-11-10 NOTE — Progress Notes (Signed)
Patient discharged home. DC instructions provided and explained. Medications reviewed. Rx given. All questions answered. Pt stable at discharge.  No psychosis noted. Denies SI, HI, AVH. Transition record and suicide risk assessment given to patient.

## 2016-11-10 NOTE — Progress Notes (Signed)
Recreation Therapy Notes  Date: 11.20.17 Time: 9:30 am Location: Craft Room  Group Topic: Self-expression  Goal Area(s) Addresses:  Patient will be able to identify a color that represents each emotion. Patient will verbalize benefit of using art as a means of self-expression. Patient will verbalize one emotion experienced while participating in activity.  Behavioral Response: Attentive, Interactive  Intervention: The Colors Within Me  Activity: Patients were given a blank face worksheet and were instructed to pick a color for each emotion they were feeling and show on the face how much of that emotion they are feeling.  Education: LRT educated patients on other forms of self-expression.  Education Outcome: Acknowledges education/In group clarification offered   Clinical Observations/Feedback: Patient drew a face and wrote what emotions she felt. Patient contributed to group discussion by stating what emotions she was feeling, and how she could feel more positive emotions.  Jacquelynn CreeGreene,Keiandre Cygan M, LRT/CTRS 11/10/2016 10:17 AM
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
## 2016-11-10 NOTE — Progress Notes (Signed)
  [facility]  Will you be returning to the same living situation after discharge:  Yes,  [facility]  At discharge, do you have transportation home?: Yes,  pt will be provided with bus passes Do you have the ability to pay for your medications: Yes,  pt will be provided with prescriptions at discharge  Release of information consent forms completed and in the chart;  Patient's signature needed at discharge.  Patient to Follow up at: Follow-up Information    [facility]   Why:  Your ACTT team will arrive at your home for your hospital follow up appointment on Wednesday November 12, 2016 between 11am-4pm for medication managment and therapy [facility] information[facility] [address] [place], [place] [address] Ph: [phone] Fax: [phone]          Next level of care provider [facility] Link:no  Safety Planning and Suicide Prevention discussed: Yes,  completed with pt  Have you used any form of tobacco in the last 30 days? (Cigarettes, Smokeless Tobacco, Cigars, and/or Pipes): Yes  Has patient been referred to the Quitline?: Patient refused referral  Patient has been referred for addiction treatment: N/A  Dorothe PeaJonathan F Ceazia Harb 11/10/2016, 3:43 PM

## 2016-11-10 NOTE — Progress Notes (Signed)
Patient awoke at 4 am and come to nurses station asking for a cup of coffee. This Clinical research associatewriter informed patient that it was 4 am and that I could get her a cup of water. Patient became upset and states, "you are the first nurse to tell me that." in a hateful tone. Patient went into room and slammed door.

## 2016-11-10 NOTE — Progress Notes (Signed)
Patient came out of room and then went back into room and slammed door again. This Clinical research associatewriter spoke to patient and redirected behavior. I asked patient why she slammed her door. Patient states, "I am a early riser and when I got up at 4 you would not let me get a cup of coffee. Can I have a cup now?" I redirected patient not to slam the door, there are other patients that are sleeping, and she can have a cup of coffee now since it was 6 am.  Patient went to dayroom and fix herself a cup of coffee.

## 2016-12-02 ENCOUNTER — Other Ambulatory Visit: Payer: Self-pay | Admitting: Internal Medicine

## 2016-12-03 ENCOUNTER — Other Ambulatory Visit: Payer: Self-pay | Admitting: Internal Medicine

## 2016-12-09 DIAGNOSIS — R350 Frequency of micturition: Secondary | ICD-10-CM | POA: Diagnosis not present

## 2016-12-16 ENCOUNTER — Other Ambulatory Visit: Payer: Self-pay | Admitting: Internal Medicine

## 2016-12-19 ENCOUNTER — Other Ambulatory Visit: Payer: Self-pay | Admitting: Internal Medicine

## 2016-12-19 DIAGNOSIS — Z1231 Encounter for screening mammogram for malignant neoplasm of breast: Secondary | ICD-10-CM

## 2017-01-07 ENCOUNTER — Ambulatory Visit: Payer: Self-pay | Admitting: Family Medicine

## 2020-04-24 ENCOUNTER — Telehealth (INDEPENDENT_AMBULATORY_CARE_PROVIDER_SITE_OTHER): Payer: Self-pay

## 2020-04-25 NOTE — Telephone Encounter (Signed)
Courtney from [facility] in [place] called and wanted either notes or cards or the stents from the above procedure faxed over I faxed over the notes from the dates mention in the note above from T. Hall CMA. To the number left by courtney.
